# Patient Record
Sex: Female | Born: 1995 | Race: White | Hispanic: No | Marital: Married | Smoking: Former smoker
Health system: Southern US, Community
[De-identification: ages and names within clinical notes are randomized; demographics above are authoritative.]

## PROBLEM LIST (undated history)

## (undated) ENCOUNTER — Inpatient Hospital Stay (HOSPITAL_COMMUNITY): Payer: Self-pay

## (undated) DIAGNOSIS — L42 Pityriasis rosea: Secondary | ICD-10-CM

## (undated) DIAGNOSIS — N926 Irregular menstruation, unspecified: Principal | ICD-10-CM

## (undated) DIAGNOSIS — K589 Irritable bowel syndrome without diarrhea: Secondary | ICD-10-CM

## (undated) DIAGNOSIS — Z3490 Encounter for supervision of normal pregnancy, unspecified, unspecified trimester: Secondary | ICD-10-CM

## (undated) DIAGNOSIS — N6029 Fibroadenosis of unspecified breast: Secondary | ICD-10-CM

## (undated) HISTORY — PX: NO PAST SURGERIES: SHX2092

## (undated) HISTORY — DX: Fibroadenosis of unspecified breast: N60.29

## (undated) HISTORY — DX: Irritable bowel syndrome, unspecified: K58.9

## (undated) HISTORY — DX: Encounter for supervision of normal pregnancy, unspecified, unspecified trimester: Z34.90

## (undated) HISTORY — DX: Irregular menstruation, unspecified: N92.6

---

## 2014-08-05 LAB — OB RESULTS CONSOLE RUBELLA ANTIBODY, IGM: Rubella: IMMUNE

## 2014-08-05 LAB — OB RESULTS CONSOLE ANTIBODY SCREEN: Antibody Screen: NEGATIVE

## 2014-08-05 LAB — OB RESULTS CONSOLE ABO/RH: "RH Type ": POSITIVE

## 2014-08-05 LAB — OB RESULTS CONSOLE VARICELLA ZOSTER ANTIBODY, IGG: Varicella: NON-IMMUNE/NOT IMMUNE

## 2014-08-05 LAB — OB RESULTS CONSOLE GC/CHLAMYDIA
Chlamydia: NEGATIVE
Gonorrhea: NEGATIVE

## 2014-08-05 LAB — OB RESULTS CONSOLE HIV ANTIBODY (ROUTINE TESTING): HIV: NONREACTIVE

## 2014-08-05 LAB — OB RESULTS CONSOLE HGB/HCT, BLOOD
HEMATOCRIT: 37 %
HEMOGLOBIN: 12.6 g/dL

## 2014-08-05 LAB — HEMOGLOBIN A1C: Hgb A1c MFr Bld: 5.2 % (ref 4.0–6.0)

## 2014-08-05 LAB — OB RESULTS CONSOLE HEPATITIS B SURFACE ANTIGEN: HEP B S AG: NEGATIVE

## 2014-08-29 NOTE — L&D Delivery Note (Signed)
Delivery Note At 4:09 PM a viable female was delivered via Vaginal, Spontaneous Delivery (Presentation:VTX LOA ;  ).  APGAR: 9-9; weight  .   Placenta status: Intact, Spontaneous.  Cord: delayed cord clamping until 2 minutes with the following complications: none   Anesthesia: Epidural  Episiotomy:   Lacerations:  Bilateral labial lacerations Suture Repair: vicryl Est. Blood Loss (mL):  175  Mom to postpartum.  Baby to Couplet care / Skin to Skin.  Sheniah Supak Grissett 03/06/2015, 4:34 PM

## 2014-10-07 ENCOUNTER — Encounter: Payer: Self-pay | Admitting: *Deleted

## 2014-10-09 ENCOUNTER — Encounter: Payer: Self-pay | Admitting: Advanced Practice Midwife

## 2014-10-09 ENCOUNTER — Ambulatory Visit (INDEPENDENT_AMBULATORY_CARE_PROVIDER_SITE_OTHER): Payer: BLUE CROSS/BLUE SHIELD | Admitting: Advanced Practice Midwife

## 2014-10-09 VITALS — BP 136/70 | Ht 63.0 in | Wt 172.0 lb

## 2014-10-09 DIAGNOSIS — Z331 Pregnant state, incidental: Secondary | ICD-10-CM

## 2014-10-09 DIAGNOSIS — Z3492 Encounter for supervision of normal pregnancy, unspecified, second trimester: Secondary | ICD-10-CM

## 2014-10-09 DIAGNOSIS — Z1389 Encounter for screening for other disorder: Secondary | ICD-10-CM

## 2014-10-09 DIAGNOSIS — Z23 Encounter for immunization: Secondary | ICD-10-CM

## 2014-10-09 DIAGNOSIS — Z363 Encounter for antenatal screening for malformations: Secondary | ICD-10-CM

## 2014-10-09 DIAGNOSIS — Z3490 Encounter for supervision of normal pregnancy, unspecified, unspecified trimester: Secondary | ICD-10-CM | POA: Insufficient documentation

## 2014-10-09 LAB — POCT URINALYSIS DIPSTICK
Blood, UA: NEGATIVE
Glucose, UA: NEGATIVE
KETONES UA: NEGATIVE
LEUKOCYTES UA: NEGATIVE
Nitrite, UA: NEGATIVE
PROTEIN UA: NEGATIVE

## 2014-10-09 MED ORDER — INFLUENZA VAC SPLIT QUAD 0.5 ML IM SUSY
0.5000 mL | PREFILLED_SYRINGE | Freq: Once | INTRAMUSCULAR | Status: AC
  Administered 2014-10-09: 0.5 mL via INTRAMUSCULAR

## 2014-10-09 NOTE — Progress Notes (Signed)
  Subjective:    Kristina Robbins is a [id number] 7866w5d being seen today for her first obstetrical visit.  [place] transferred to [place], where she had 3-4 visits.  [facility]. Her PNC thus far has been uncomplicated. Her obstetrical history is significant for 2 1st trimester SAB.  Pregnancy history fully reviewed.  Patient reports no complaints. Had a lot of nausea 1st trimester and feels like she lost 20lb, but first weight recorded at 9 weeks, she was 176lbs.  She states that her mother has cystic fibrosis, but did not get tested because her insurance doesn't cover it  Filed Vitals:   10/09/14 1440 10/09/14 1444  BP: 136/70   Height:  5\' 3"  (1.6 m)  Weight: 172 lb (78.019 kg)     HISTORY: OB History  Gravida Para Term Preterm AB SAB TAB Ectopic Multiple Living  3    2         # Outcome Date GA Lbr Len/2nd Weight Sex Delivery Anes PTL Lv  3 Current           2 AB           1 AB              Past Medical History  Diagnosis Date  . Irritable bowel syndrome   . Cystic fibroadenosis of breast    Past Surgical History  Procedure Laterality Date  . No past surgeries     Family History  Problem Relation Age of Onset  . Cystic fibrosis Mother   . Autism Brother      Exam                                           Skin: normal coloration and turgor, no rashes    Neurologic: oriented, normal, normal mood   Extremities: normal strength, tone, and muscle mass   HEENT PERRLA   Mouth/Teeth mucous membranes moist, normal dentition   Neck supple and no masses   Cardiovascular: regular rate and rhythm   Respiratory:  appears well, vitals normal, no respiratory distress, acyanotic   Abdomen: soft, non-tender;  FHR: 150          Assessment:    Pregnancy: [id number] Patient Active Problem List   Diagnosis Date Noted  . Supervision of normal pregnancy 10/09/2014        Plan:      Continue prenatal vitamins  Problem list reviewed and updated  Reviewed n/v relief measures and warning s/s to report  Reviewed recommended weight gain based on pre-gravid BMI  Encouraged well-balanced diet  Ultrasound discussed; fetal survey: requested.  Follow up in 2 weeks for anatomy screen.  CRESENZO-DISHMAN,Rudolf Blizard 10/09/2014

## 2014-10-16 ENCOUNTER — Telehealth: Payer: Self-pay | Admitting: *Deleted

## 2014-10-16 NOTE — Telephone Encounter (Signed)
Pt states that she thinks she may have had a panic attack. Pt states that her heart pounds and it scares her. Pt states that 3 nights ago she experienced the same thing and thought she might pass out, heart rate was normal. Pt states that the same thing happened last night as well, she called the after hour nurse and was told to the ED due to heart rate being 147, pt did not go to the ED because the episode had stopped. Pt was given an appointment for 8:45 in the morning with Dr. Despina HiddenEure and was also told that if she has another episode tonight that she needs to go straight to an ED for evaluation. Pt verbalized understanding.

## 2014-10-17 ENCOUNTER — Encounter: Payer: BLUE CROSS/BLUE SHIELD | Admitting: Obstetrics & Gynecology

## 2014-10-20 ENCOUNTER — Telehealth: Payer: Self-pay | Admitting: Women's Health

## 2014-10-20 NOTE — Telephone Encounter (Signed)
Spoke with pt. Pt had sex last pm and noticed bleeding afterwards. Bleeding has stopped now. I advised it's not uncommon to have bleeding after sex or after a BM. Advised to not have sex for 7 days from the last time she wipes any color.  Advised everything sounds ok at this point. Pt has a scheduled appt on Friday. Advised to keep that appt and call sooner if anything changes. Pt voiced understanding. JSY

## 2014-10-24 ENCOUNTER — Ambulatory Visit (INDEPENDENT_AMBULATORY_CARE_PROVIDER_SITE_OTHER): Payer: BC Managed Care – PPO

## 2014-10-24 ENCOUNTER — Other Ambulatory Visit: Payer: Self-pay | Admitting: Advanced Practice Midwife

## 2014-10-24 ENCOUNTER — Ambulatory Visit (INDEPENDENT_AMBULATORY_CARE_PROVIDER_SITE_OTHER): Payer: BLUE CROSS/BLUE SHIELD | Admitting: Obstetrics and Gynecology

## 2014-10-24 DIAGNOSIS — Z36 Encounter for antenatal screening of mother: Secondary | ICD-10-CM

## 2014-10-24 DIAGNOSIS — Z363 Encounter for antenatal screening for malformations: Secondary | ICD-10-CM

## 2014-10-24 DIAGNOSIS — O09292 Supervision of pregnancy with other poor reproductive or obstetric history, second trimester: Secondary | ICD-10-CM

## 2014-10-24 DIAGNOSIS — Z3482 Encounter for supervision of other normal pregnancy, second trimester: Secondary | ICD-10-CM

## 2014-10-24 DIAGNOSIS — Z331 Pregnant state, incidental: Secondary | ICD-10-CM

## 2014-10-24 DIAGNOSIS — Z1389 Encounter for screening for other disorder: Secondary | ICD-10-CM

## 2014-10-24 LAB — POCT URINALYSIS DIPSTICK
Blood, UA: NEGATIVE
GLUCOSE UA: NEGATIVE
Ketones, UA: NEGATIVE
Leukocytes, UA: NEGATIVE
Nitrite, UA: NEGATIVE
PROTEIN UA: NEGATIVE

## 2014-10-24 NOTE — Progress Notes (Signed)
U/S(19+6wks)-active fetus, meas c/w dates, fluid WNL, FHR- 164 bpm, anterior Gr 0 placenta, cx appears closed(3.3cm), bilateral adnexa appears WNL, unable to view cardiac OFT's due to fetal position and ? Lt vent EICf noted, female fetus

## 2014-10-24 NOTE — Progress Notes (Signed)
Pt states that she had some bleeding last week after intercourse, but has not had any since.

## 2014-10-24 NOTE — Progress Notes (Signed)
Patient ID: Kristina Robbins, female   DOB: Mar 22, 1996, 19 y.o.   MRN: [id number]  [id number] 2887w6d Estimated Date of Delivery: 03/14/15  Blood pressure 120/80, pulse 82.   refer to the ob flow sheet for FH and FHR, also BP, Wt, Urine results:notable for negative  Patient reports  + good fetal movement, denies any bleeding and no rupture of membranes symptoms or regular contractions. Patient complaints:none. This is father's 5th child. He notes limited involvement with previous 4 children. Childbirth classes, Strongly encouraged.  FHR 164  Questions were answered. 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 This patient was seen in room 1 and the patient's care was started at 10:57 AM.   I personally performed the services described in this documentation, which was scribed in my presence. The recorded information has been reviewed and considered accurate. It has been edited as necessary during review. Tilda BurrowFERGUSON,Eustolia Drennen V, MD

## 2014-10-26 ENCOUNTER — Encounter (HOSPITAL_COMMUNITY): Payer: Self-pay | Admitting: *Deleted

## 2014-10-26 ENCOUNTER — Inpatient Hospital Stay (HOSPITAL_COMMUNITY)
Admission: AD | Admit: 2014-10-26 | Discharge: 2014-10-27 | Disposition: A | Discharge status: Home / Self Care | Payer: Medicaid Other | Source: Ambulatory Visit | Attending: Obstetrics & Gynecology | Admitting: Obstetrics & Gynecology

## 2014-10-26 DIAGNOSIS — M545 Low back pain: Secondary | ICD-10-CM | POA: Insufficient documentation

## 2014-10-26 DIAGNOSIS — Z3A20 20 weeks gestation of pregnancy: Secondary | ICD-10-CM | POA: Insufficient documentation

## 2014-10-26 DIAGNOSIS — Z87891 Personal history of nicotine dependence: Secondary | ICD-10-CM | POA: Insufficient documentation

## 2014-10-26 DIAGNOSIS — M549 Dorsalgia, unspecified: Secondary | ICD-10-CM | POA: Diagnosis present

## 2014-10-26 DIAGNOSIS — O9989 Other specified diseases and conditions complicating pregnancy, childbirth and the puerperium: Secondary | ICD-10-CM | POA: Insufficient documentation

## 2014-10-26 LAB — URINALYSIS, ROUTINE W REFLEX MICROSCOPIC
BILIRUBIN URINE: NEGATIVE
Glucose, UA: NEGATIVE mg/dL
HGB URINE DIPSTICK: NEGATIVE
KETONES UR: NEGATIVE mg/dL
LEUKOCYTES UA: NEGATIVE
Nitrite: NEGATIVE
PROTEIN: NEGATIVE mg/dL
SPECIFIC GRAVITY, URINE: 1.02 (ref 1.005–1.030)
Urobilinogen, UA: 0.2 mg/dL (ref 0.0–1.0)
pH: 5.5 (ref 5.0–8.0)

## 2014-10-26 NOTE — MAU Note (Signed)
PT  SAYS SHE HAS BEEN HAVING  BACK  PAIN -  COMES/ GOES   X2 WEEKS -  NO PROBLEMS   VOIDING.     [facility]  UA- NEG.     NOW FEELS LIKE CRAMPING - UC-    .     DENIES HSV AND MRSA.   LAST SEX-  Thursday.

## 2014-10-26 NOTE — MAU Note (Signed)
Pt reports pain in lower back off/on x 2 weeks, today the pain worsened and started radiating to her lower abd. Denies bleeding.

## 2014-10-27 ENCOUNTER — Telehealth: Payer: Self-pay | Admitting: *Deleted

## 2014-10-27 DIAGNOSIS — M549 Dorsalgia, unspecified: Secondary | ICD-10-CM

## 2014-10-27 DIAGNOSIS — O9989 Other specified diseases and conditions complicating pregnancy, childbirth and the puerperium: Secondary | ICD-10-CM

## 2014-10-27 DIAGNOSIS — Z3A29 29 weeks gestation of pregnancy: Secondary | ICD-10-CM

## 2014-10-27 NOTE — MAU Provider Note (Signed)
  History   G3P0020 at 20.1 wks in with c/o sharp shooting pain into back that started today. Pt states has not happened since arriving at hospital. Denies ROM , urinary freq or pain with urination.  CSN: [id number]  Arrival date and time: 10/26/14 2304   None     Chief Complaint  Patient presents with  . Back Pain   Back Pain This is a new problem. The current episode started today. The problem occurs intermittently. The problem has been resolved since onset. The pain is present in the lumbar spine. The quality of the pain is described as shooting and stabbing. The pain does not radiate. The pain is moderate. Worse during: worse when on her feet. The symptoms are aggravated by standing.    OB History    Gravida Para Term Preterm AB TAB SAB Ectopic Multiple Living   3    2           Past Medical History  Diagnosis Date  . Irritable bowel syndrome   . Cystic fibroadenosis of breast     Past Surgical History  Procedure Laterality Date  . No past surgeries      Family History  Problem Relation Age of Onset  . Cystic fibrosis Mother   . Autism Brother     History  Substance Use Topics  . Smoking status: Former Games developermoker  . Smokeless tobacco: Never Used  . Alcohol Use: No    Allergies: No Known Allergies  Prescriptions prior to admission  Medication Sig Dispense Refill Last Dose  . Doxylamine-Pyridoxine 10-10 MG TBEC Take 1 tablet by mouth as needed.   Past Week at Unknown time  . flintstones complete (FLINTSTONES) 60 MG chewable tablet Chew 2 tablets by mouth daily.   10/25/2014 at Unknown time    Review of Systems  Constitutional: Negative.   HENT: Negative.   Eyes: Negative.   Respiratory: Negative.   Cardiovascular: Negative.   Gastrointestinal: Negative.   Genitourinary: Negative.   Musculoskeletal: Positive for back pain.  Neurological: Negative.   Endo/Heme/Allergies: Negative.   Psychiatric/Behavioral: Negative.    Physical Exam   Blood pressure  111/63, pulse 122, temperature 98.7 F (37.1 C), temperature source Oral, resp. rate 18, height 5\' 3"  (1.6 m), weight 170 lb (77.111 kg), SpO2 100 %.  Physical Exam  Constitutional: She is oriented to person, place, and time. She appears well-developed and well-nourished.  HENT:  Head: Normocephalic.  Neck: Normal range of motion.  Cardiovascular: Normal rate, regular rhythm, normal heart sounds and intact distal pulses.   Respiratory: Effort normal and breath sounds normal.  GI: Soft. Bowel sounds are normal.  Genitourinary: Vagina normal and uterus normal.  Musculoskeletal: Normal range of motion.  Neurological: She is alert and oriented to person, place, and time. She has normal reflexes.  Skin: Skin is warm and dry.  Psychiatric: She has a normal mood and affect. Her behavior is normal. Judgment and thought content normal.    MAU Course  Procedures  MDM D/c home  Assessment and Plan  Urine clear, SVE done cervix cl/firm/post/ high  LAWSON, MARIE DARLENE 10/27/2014, 12:16 AM

## 2014-10-27 NOTE — Telephone Encounter (Signed)
Spoke with pt. Pt states she had pain in back that would shoot into her low abd over the weekend. Was happening every 10 minutes. She called the after hours nurse line and they advised that she go [facility]. Pt did and was told " baby may be on a nerve". Pt states she has had no pain today so far. No spotting or bleeding. I advised if this happens again, push fluids and lay on left side for 1 hour. Pt states she did that for 15 minutes when she was having pain over the weekend and it made pain worse. I advised she needs to be seen tomorrow and if anything changes, call us back if during office hours or the after hours nurse line if office is closed. Pt voiced understanding. Call transferred to front desk for appt. JSY

## 2014-10-27 NOTE — MAU Note (Signed)
SAYS  SHE HAS NOT  FELT  SAME PAIN SINCE  SHE GOT OUT OF CAR ON ARRIVAL

## 2014-10-28 ENCOUNTER — Ambulatory Visit (INDEPENDENT_AMBULATORY_CARE_PROVIDER_SITE_OTHER): Payer: BC Managed Care – PPO | Admitting: Advanced Practice Midwife

## 2014-10-28 ENCOUNTER — Encounter: Payer: Self-pay | Admitting: Advanced Practice Midwife

## 2014-10-28 VITALS — BP 110/70 | HR 116 | Wt 170.0 lb

## 2014-10-28 DIAGNOSIS — Z1389 Encounter for screening for other disorder: Secondary | ICD-10-CM

## 2014-10-28 DIAGNOSIS — Z331 Pregnant state, incidental: Secondary | ICD-10-CM

## 2014-10-28 DIAGNOSIS — Z3492 Encounter for supervision of normal pregnancy, unspecified, second trimester: Secondary | ICD-10-CM

## 2014-10-28 LAB — POCT URINALYSIS DIPSTICK
GLUCOSE UA: NEGATIVE
NITRITE UA: NEGATIVE
Protein, UA: NEGATIVE
RBC UA: NEGATIVE

## 2014-10-28 NOTE — Progress Notes (Signed)
[id number] 10448w3d Estimated Date of Delivery: 03/14/15  Blood pressure 110/70, pulse 116, weight 170 lb (77.111 kg).   Work in F/U from MAU:  Seen Sunday with sharp shooting abdominal pain. CXT LTC, Firm.  Dx: Nerve pain   Still having occ "sharp shooting pain" in her "tailbone"  Occ radiates to hips   BP weight and urine results all reviewed and noted.   Urine large ketones Patient reports good fetal movement, denies any bleeding and no rupture of membranes symptoms or regular contractions. Patient is without complaints. All questions were answered.  Plan:  Continued routine obstetrical care, Increase PO fluids   Follow up in as scheduled for OB appointment,

## 2014-11-24 ENCOUNTER — Encounter: Payer: Self-pay | Admitting: Obstetrics & Gynecology

## 2014-11-24 ENCOUNTER — Ambulatory Visit (INDEPENDENT_AMBULATORY_CARE_PROVIDER_SITE_OTHER): Payer: BC Managed Care – PPO | Admitting: Obstetrics & Gynecology

## 2014-11-24 VITALS — BP 100/80 | HR 72 | Wt 172.0 lb

## 2014-11-24 DIAGNOSIS — O283 Abnormal ultrasonic finding on antenatal screening of mother: Secondary | ICD-10-CM

## 2014-11-24 DIAGNOSIS — Z3492 Encounter for supervision of normal pregnancy, unspecified, second trimester: Secondary | ICD-10-CM

## 2014-11-24 DIAGNOSIS — Z331 Pregnant state, incidental: Secondary | ICD-10-CM

## 2014-11-24 DIAGNOSIS — Z1389 Encounter for screening for other disorder: Secondary | ICD-10-CM

## 2014-11-24 LAB — POCT URINALYSIS DIPSTICK
Glucose, UA: NEGATIVE
Ketones, UA: NEGATIVE
LEUKOCYTES UA: NEGATIVE
Nitrite, UA: NEGATIVE
Protein, UA: NEGATIVE
RBC UA: NEGATIVE

## 2014-11-24 NOTE — Addendum Note (Signed)
Addended by: Richardson ChiquitoRAVIS, ASHLEY M on: 11/24/2014 01:36 PM   Modules accepted: Orders

## 2014-11-24 NOTE — Progress Notes (Signed)
[id number] 4133w2d Estimated Date of Delivery: 03/14/15  Blood pressure 100/80, pulse 72, weight 172 lb (78.019 kg).   BP weight and urine results all reviewed and noted.  Please refer to the obstetrical flow sheet for the fundal height and fetal heart rate documentation:  Patient reports good fetal movement, denies any bleeding and no rupture of membranes symptoms or regular contractions. Patient is without complaints. All questions were answered.  Plan:  Continued routine obstetrical care,   Follow up in 4 weeks for OB appointment, PN2 + sonogram  PN2 next visit

## 2014-12-08 ENCOUNTER — Encounter: Payer: Self-pay | Admitting: Advanced Practice Midwife

## 2014-12-18 ENCOUNTER — Telehealth: Payer: Self-pay | Admitting: *Deleted

## 2014-12-18 ENCOUNTER — Encounter (HOSPITAL_COMMUNITY): Payer: Self-pay | Admitting: *Deleted

## 2014-12-18 ENCOUNTER — Inpatient Hospital Stay (HOSPITAL_COMMUNITY)
Admission: AD | Admit: 2014-12-18 | Discharge: 2014-12-18 | Disposition: A | Discharge status: Home / Self Care | Payer: Medicaid Other | Source: Ambulatory Visit | Attending: Family Medicine | Admitting: Family Medicine

## 2014-12-18 DIAGNOSIS — Z3A27 27 weeks gestation of pregnancy: Secondary | ICD-10-CM | POA: Diagnosis not present

## 2014-12-18 DIAGNOSIS — R109 Unspecified abdominal pain: Secondary | ICD-10-CM | POA: Diagnosis present

## 2014-12-18 DIAGNOSIS — O4702 False labor before 37 completed weeks of gestation, second trimester: Secondary | ICD-10-CM

## 2014-12-18 DIAGNOSIS — O99332 Smoking (tobacco) complicating pregnancy, second trimester: Secondary | ICD-10-CM | POA: Insufficient documentation

## 2014-12-18 LAB — URINALYSIS, ROUTINE W REFLEX MICROSCOPIC
Bilirubin Urine: NEGATIVE
Glucose, UA: NEGATIVE mg/dL
Hgb urine dipstick: NEGATIVE
Ketones, ur: NEGATIVE mg/dL
Nitrite: NEGATIVE
PROTEIN: NEGATIVE mg/dL
Specific Gravity, Urine: 1.015 (ref 1.005–1.030)
UROBILINOGEN UA: 0.2 mg/dL (ref 0.0–1.0)
pH: 6 (ref 5.0–8.0)

## 2014-12-18 LAB — URINE MICROSCOPIC-ADD ON

## 2014-12-18 LAB — FETAL FIBRONECTIN: Fetal Fibronectin: NEGATIVE

## 2014-12-18 NOTE — Telephone Encounter (Signed)
Pt called stating that she is having really bad cramps and constant pain and every now and then a sharp pain. Pt stated that she is cramping from her hip bones to her back and into her stomach. Pt states that she lost her mucus plug a couple days ago and that she has not felt the baby move in the hour and a hafl that she has been awake. Pt denies any gush of fluid or bleeding.   I spoke with Dr. Emelda FearFerguson about above and he advised that the [facility] I advised the pt of this and she verbalized understanding.

## 2014-12-18 NOTE — MAU Note (Signed)
Abd pain & pressure since 1500 today, entire abdomen feels tight - constant pressure, also sharp cramping.  Denies bleeding or LOF.

## 2014-12-18 NOTE — Discharge Instructions (Signed)
Preterm Labor Information Preterm labor is when labor starts at less than 37 weeks of pregnancy. The normal length of a pregnancy is 39 to 41 weeks. CAUSES Often, there is no identifiable underlying cause as to why a woman goes into preterm labor. One of the most common known causes of preterm labor is infection. Infections of the uterus, cervix, vagina, amniotic sac, bladder, kidney, or even the lungs (pneumonia) can cause labor to start. Other suspected causes of preterm labor include:   Urogenital infections, such as yeast infections and bacterial vaginosis.   Uterine abnormalities (uterine shape, uterine septum, fibroids, or bleeding from the placenta).   A cervix that has been operated on (it may fail to stay closed).   Malformations in the fetus.   Multiple gestations (twins, triplets, and so on).   Breakage of the amniotic sac.  RISK FACTORS  Having a previous history of preterm labor.   Having premature rupture of membranes (PROM).   Having a placenta that covers the opening of the cervix (placenta previa).   Having a placenta that separates from the uterus (placental abruption).   Having a cervix that is too weak to hold the fetus in the uterus (incompetent cervix).   Having too much fluid in the amniotic sac (polyhydramnios).   Taking illegal drugs or smoking while pregnant.   Not gaining enough weight while pregnant.   Being younger than 48 and older than 21 years old.   Having a low socioeconomic status.   Being African American. SYMPTOMS Signs and symptoms of preterm labor include:   Menstrual-like cramps, abdominal pain, or back pain.  Uterine contractions that are regular, as frequent as six in an hour, regardless of their intensity (may be mild or painful).  Contractions that start on the top of the uterus and spread down to the lower abdomen and back.   A sense of increased pelvic pressure.   A watery or bloody mucus discharge that  comes from the vagina.  TREATMENT Depending on the length of the pregnancy and other circumstances, your health care provider may suggest bed rest. If necessary, there are medicines that can be given to stop contractions and to mature the fetal lungs. If labor happens before 34 weeks of pregnancy, a prolonged hospital stay may be recommended. Treatment depends on the condition of both you and the fetus.  WHAT SHOULD YOU DO IF YOU THINK YOU ARE IN PRETERM LABOR? Call your health care provider right away. You will need to go to the hospital to get checked immediately. HOW CAN YOU PREVENT PRETERM LABOR IN FUTURE PREGNANCIES? You should:   Stop smoking if you smoke.  Maintain healthy weight gain and avoid chemicals and drugs that are not necessary.  Be watchful for any type of infection.  Inform your health care provider if you have a known history of preterm labor. Document Released: 11/05/2003 Document Revised: 04/17/2013 Document Reviewed: 09/17/2012 [facility] Patient Information 2015 [place], [place]. This information is not intended to replace advice given to you by your health care provider. Make sure you discuss any questions you have with your health care provider.   Pelvic Rest Pelvic rest is sometimes recommended for women when:   The placenta is partially or completely covering the opening of the cervix (placenta previa).  There is bleeding between the uterine wall and the amniotic sac in the first trimester (subchorionic hemorrhage).  The cervix begins to open without labor starting (incompetent cervix, cervical insufficiency).  The labor is too early (  preterm labor). HOME CARE INSTRUCTIONS  Do not have sexual intercourse, stimulation, or an orgasm.  Do not use tampons, douche, or put anything in the vagina.  Do not lift anything over 10 pounds (4.5 kg).  Avoid strenuous activity or straining your pelvic muscles. SEEK MEDICAL CARE IF:  You have any vaginal bleeding  during pregnancy. Treat this as a potential emergency.  You have cramping pain felt low in the stomach (stronger than menstrual cramps).  You notice vaginal discharge (watery, mucus, or bloody).  You have a low, dull backache.  There are regular contractions or uterine tightening. SEEK IMMEDIATE MEDICAL CARE IF: You have vaginal bleeding and have placenta previa.  Document Released: 12/10/2010 Document Revised: 11/07/2011 Document Reviewed: 12/10/2010 [facility] Patient Information 2015 [place], [place]. This information is not intended to replace advice given to you by your health care provider. Make sure you discuss any questions you have with your health care provider.  Braxton Hicks Contractions Contractions of the uterus can occur throughout pregnancy. Contractions are not always a sign that you are in labor.  WHAT ARE BRAXTON HICKS CONTRACTIONS?  Contractions that occur before labor are called Braxton Hicks contractions, or false labor. Toward the end of pregnancy (32-34 weeks), these contractions can develop more often and may become more forceful. This is not true labor because these contractions do not result in opening (dilatation) and thinning of the cervix. They are sometimes difficult to tell apart from true labor because these contractions can be forceful and people have different pain tolerances. You should not feel embarrassed if you go to the hospital with false labor. Sometimes, the only way to tell if you are in true labor is for your health care provider to look for changes in the cervix. If there are no prenatal problems or other health problems associated with the pregnancy, it is completely safe to be sent home with false labor and await the onset of true labor. HOW CAN YOU TELL THE DIFFERENCE BETWEEN TRUE AND FALSE LABOR? False Labor  The contractions of false labor are usually shorter and not as hard as those of true labor.   The contractions are usually irregular.    The contractions are often felt in the front of the lower abdomen and in the groin.   The contractions may go away when you walk around or change positions while lying down.   The contractions get weaker and are shorter lasting as time goes on.   The contractions do not usually become progressively stronger, regular, and closer together as with true labor.  True Labor  Contractions in true labor last 30-70 seconds, become very regular, usually become more intense, and increase in frequency.   The contractions do not go away with walking.   The discomfort is usually felt in the top of the uterus and spreads to the lower abdomen and low back.   True labor can be determined by your health care provider with an exam. This will show that the cervix is dilating and getting thinner.  WHAT TO REMEMBER  Keep up with your usual exercises and follow other instructions given by your health care provider.   Take medicines as directed by your health care provider.   Keep your regular prenatal appointments.   Eat and drink lightly if you think you are going into labor.   If Braxton Hicks contractions are making you uncomfortable:   Change your position from lying down or resting to walking, or from walking to resting.  Sit and rest in a tub of warm water.   Drink 2-3 glasses of water. Dehydration may cause these contractions.   Do slow and deep breathing several times an hour.  WHEN SHOULD I SEEK IMMEDIATE MEDICAL CARE? Seek immediate medical care if:  Your contractions become stronger, more regular, and closer together.   You have fluid leaking or gushing from your vagina.   You have a fever.   You pass blood-tinged mucus.   You have vaginal bleeding.   You have continuous abdominal pain.   You have low back pain that you never had before.   You feel your baby's head pushing down and causing pelvic pressure.   Your baby is not moving as much as  it used to.  Document Released: 08/15/2005 Document Revised: 08/20/2013 Document Reviewed: 05/27/2013 [facility] Patient Information 2015 [place], [place]. This information is not intended to replace advice given to you by your health care provider. Make sure you discuss any questions you have with your health care provider.  Preterm Labor Information Preterm labor is when labor starts at less than 37 weeks of pregnancy. The normal length of a pregnancy is 39 to 41 weeks. CAUSES Often, there is no identifiable underlying cause as to why a woman goes into preterm labor. One of the most common known causes of preterm labor is infection. Infections of the uterus, cervix, vagina, amniotic sac, bladder, kidney, or even the lungs (pneumonia) can cause labor to start. Other suspected causes of preterm labor include:   Urogenital infections, such as yeast infections and bacterial vaginosis.   Uterine abnormalities (uterine shape, uterine septum, fibroids, or bleeding from the placenta).   A cervix that has been operated on (it may fail to stay closed).   Malformations in the fetus.   Multiple gestations (twins, triplets, and so on).   Breakage of the amniotic sac.  RISK FACTORS  Having a previous history of preterm labor.   Having premature rupture of membranes (PROM).   Having a placenta that covers the opening of the cervix (placenta previa).   Having a placenta that separates from the uterus (placental abruption).   Having a cervix that is too weak to hold the fetus in the uterus (incompetent cervix).   Having too much fluid in the amniotic sac (polyhydramnios).   Taking illegal drugs or smoking while pregnant.   Not gaining enough weight while pregnant.   Being younger than 34 and older than 21 years old.   Having a low socioeconomic status.   Being African American. SYMPTOMS Signs and symptoms of preterm labor include:   Menstrual-like cramps, abdominal pain,  or back pain.  Uterine contractions that are regular, as frequent as six in an hour, regardless of their intensity (may be mild or painful).  Contractions that start on the top of the uterus and spread down to the lower abdomen and back.   A sense of increased pelvic pressure.   A watery or bloody mucus discharge that comes from the vagina.  TREATMENT Depending on the length of the pregnancy and other circumstances, your health care provider may suggest bed rest. If necessary, there are medicines that can be given to stop contractions and to mature the fetal lungs. If labor happens before 34 weeks of pregnancy, a prolonged hospital stay may be recommended. Treatment depends on the condition of both you and the fetus.  WHAT SHOULD YOU DO IF YOU THINK YOU ARE IN PRETERM LABOR? Call your health care provider right away.  You will need to go to the hospital to get checked immediately. HOW CAN YOU PREVENT PRETERM LABOR IN FUTURE PREGNANCIES? You should:   Stop smoking if you smoke.  Maintain healthy weight gain and avoid chemicals and drugs that are not necessary.  Be watchful for any type of infection.  Inform your health care provider if you have a known history of preterm labor. Document Released: 11/05/2003 Document Revised: 04/17/2013 Document Reviewed: 09/17/2012 [facility] Patient Information 2015 [place]. This information is not intended to replace advice given to you by your health care provider. Make sure you discuss any questions you have with your health care provider.

## 2014-12-18 NOTE — MAU Provider Note (Signed)
Chief Complaint: Abdominal Pain   First Provider Initiated Contact with Patient 12/18/14 1906     SUBJECTIVE HPI: Kristina Robbins is a 19 y.o. G3P0020 at 838w5d by LMP who presents to [facility] reporting sporadic abdominal tightening and constant pelvic pressure since 3 PM. Felt like she needed to have a BM, but couldn't. Had a few normal BMs already today so doesn't think she's constipated. Denies bleeding or LOF. No problems with preterm labor so far this pregnancy. Last intercourse 2 days ago.  Past Medical History  Diagnosis Date  . Irritable bowel syndrome   . Cystic fibroadenosis of breast    OB History  Gravida Para Term Preterm AB SAB TAB Ectopic Multiple Living  3    2         # Outcome Date GA Lbr Len/2nd Weight Sex Delivery Anes PTL Lv  3 Current           2 AB           1 AB              Past Surgical History  Procedure Laterality Date  . No past surgeries     History   Social History  . Marital Status: Married    Spouse Name: N/A  . Number of Children: N/A  . Years of Education: N/A   Occupational History  . Not on file.   Social History Main Topics  . Smoking status: Current Every Day Smoker -- 0.50 packs/day  . Smokeless tobacco: Never Used  . Alcohol Use: No  . Drug Use: No  . Sexual Activity: Yes   Other Topics Concern  . Not on file   Social History Narrative   No current facility-administered medications on file prior to encounter.   Current Outpatient Prescriptions on File Prior to Encounter  Medication Sig Dispense Refill  . flintstones complete (FLINTSTONES) 60 MG chewable tablet Chew 2 tablets by mouth daily.     No Known Allergies  Review of Systems  Constitutional: Negative for fever and chills.  Gastrointestinal: Positive for abdominal pain. Negative for nausea, vomiting, diarrhea, constipation and blood in stool.  Genitourinary: Positive for dysuria. Negative for urgency, frequency, hematuria and flank pain.    Positive for pelvic pressure. Negative for vaginal bleeding, leaking of fluid, vaginal discharge.   OBJECTIVE Blood pressure 115/70, pulse 101, temperature 98.1 F (36.7 C), temperature source Oral, resp. rate 18. GENERAL: Well-developed, well-nourished female in no acute distress.  HEART: normal rate RESP: normal effort GI: Abdomen soft, non-tender. Gravid, size equals dates.  MS: Nontender, no edema NEURO: Alert and oriented SPECULUM EXAM: NEFG, physiologic discharge, no blood noted, cervix long/closed, medium consistency, posterior  EFM: 150s, moderate variability, positive accelerations, few mild variable decelerations. Reassuring for gestational age. Toco: Rare, mild. Palpates soft between contractions  LAB RESULTS Results for orders placed or performed during the hospital encounter of 12/18/14 (from the past 24 hour(s))  Urinalysis, Routine w reflex microscopic     Status: Abnormal   Collection Time: 12/18/14  6:07 PM  Result Value Ref Range   Color, Urine YELLOW YELLOW   APPearance CLEAR CLEAR   Specific Gravity, Urine 1.015 1.005 - 1.030   pH 6.0 5.0 - 8.0   Glucose, UA NEGATIVE NEGATIVE mg/dL   Hgb urine dipstick NEGATIVE NEGATIVE   Bilirubin Urine NEGATIVE NEGATIVE   Ketones, ur NEGATIVE NEGATIVE mg/dL   Protein, ur NEGATIVE NEGATIVE mg/dL   Urobilinogen, UA 0.2 0.0 - 1.0 mg/dL  Nitrite NEGATIVE NEGATIVE   Leukocytes, UA TRACE (A) NEGATIVE  Urine microscopic-add on     Status: Abnormal   Collection Time: 12/18/14  6:07 PM  Result Value Ref Range   Squamous Epithelial / LPF MANY (A) RARE   WBC, UA 0-2 <3 WBC/hpf   RBC / HPF 0-2 <3 RBC/hpf   Bacteria, UA FEW (A) RARE   Results for orders placed or performed during the hospital encounter of 12/18/14 (from the past 24 hour(s))  Urinalysis, Routine w reflex microscopic     Status: Abnormal   Collection Time: 12/18/14  6:07 PM  Result Value Ref Range   Color, Urine YELLOW YELLOW   APPearance CLEAR CLEAR   Specific  Gravity, Urine 1.015 1.005 - 1.030   pH 6.0 5.0 - 8.0   Glucose, UA NEGATIVE NEGATIVE mg/dL   Hgb urine dipstick NEGATIVE NEGATIVE   Bilirubin Urine NEGATIVE NEGATIVE   Ketones, ur NEGATIVE NEGATIVE mg/dL   Protein, ur NEGATIVE NEGATIVE mg/dL   Urobilinogen, UA 0.2 0.0 - 1.0 mg/dL   Nitrite NEGATIVE NEGATIVE   Leukocytes, UA TRACE (A) NEGATIVE  Urine microscopic-add on     Status: Abnormal   Collection Time: 12/18/14  6:07 PM  Result Value Ref Range   Squamous Epithelial / LPF MANY (A) RARE   WBC, UA 0-2 <3 WBC/hpf   RBC / HPF 0-2 <3 RBC/hpf   Bacteria, UA FEW (A) RARE  Fetal fibronectin     Status: None   Collection Time: 12/18/14  7:20 PM  Result Value Ref Range   Fetal Fibronectin NEGATIVE NEGATIVE    IMAGING No results found.  MAU COURSE  ASSESSMENT 1. Preterm uterine contractions, antepartum, second trimester    PLAN Discharge home in stable condition. Preterm labor precautions. Push fluids.   Follow-up Information    Follow up with [facility] On 12/25/2014.   Contact information:   [address] [place] [id number] [phone]      Follow up with [facility] [place] [facility].   Why:  As needed in emergencies   Contact information:   [address] [id number] mc [place] [address] [phone]        Medication List    TAKE these medications        acetaminophen 500 MG tablet  Commonly known as:  TYLENOL  Take 1,000 mg by mouth every 6 (six) hours as needed for mild pain.     flintstones complete 60 MG chewable tablet  Chew 2 tablets by mouth daily.     omeprazole 20 MG capsule  Commonly known as:  PRILOSEC  Take 20 mg by mouth daily.       [place], [place] 12/18/2014  6:40 PM

## 2014-12-18 NOTE — MAU Note (Signed)
C/o decreased FM but  +FM now;

## 2014-12-18 NOTE — MAU Note (Signed)
No fetal movement since 1500.

## 2014-12-25 ENCOUNTER — Ambulatory Visit (INDEPENDENT_AMBULATORY_CARE_PROVIDER_SITE_OTHER): Payer: BC Managed Care – PPO | Admitting: Women's Health

## 2014-12-25 ENCOUNTER — Other Ambulatory Visit: Payer: BC Managed Care – PPO

## 2014-12-25 ENCOUNTER — Ambulatory Visit (INDEPENDENT_AMBULATORY_CARE_PROVIDER_SITE_OTHER): Payer: BC Managed Care – PPO

## 2014-12-25 ENCOUNTER — Encounter: Payer: Self-pay | Admitting: Women's Health

## 2014-12-25 VITALS — BP 112/58 | HR 80 | Wt 173.0 lb

## 2014-12-25 DIAGNOSIS — Z331 Pregnant state, incidental: Secondary | ICD-10-CM

## 2014-12-25 DIAGNOSIS — O35BXX1 Maternal care for other (suspected) fetal abnormality and damage, fetal cardiac anomalies, fetus 1: Secondary | ICD-10-CM

## 2014-12-25 DIAGNOSIS — Z1389 Encounter for screening for other disorder: Secondary | ICD-10-CM

## 2014-12-25 DIAGNOSIS — O283 Abnormal ultrasonic finding on antenatal screening of mother: Secondary | ICD-10-CM

## 2014-12-25 DIAGNOSIS — Z369 Encounter for antenatal screening, unspecified: Secondary | ICD-10-CM

## 2014-12-25 DIAGNOSIS — O358XX1 Maternal care for other (suspected) fetal abnormality and damage, fetus 1: Secondary | ICD-10-CM

## 2014-12-25 DIAGNOSIS — O358XX0 Maternal care for other (suspected) fetal abnormality and damage, not applicable or unspecified: Secondary | ICD-10-CM | POA: Insufficient documentation

## 2014-12-25 DIAGNOSIS — Z131 Encounter for screening for diabetes mellitus: Secondary | ICD-10-CM

## 2014-12-25 DIAGNOSIS — O35BXX0 Maternal care for other (suspected) fetal abnormality and damage, fetal cardiac anomalies, not applicable or unspecified: Secondary | ICD-10-CM | POA: Insufficient documentation

## 2014-12-25 DIAGNOSIS — Z3493 Encounter for supervision of normal pregnancy, unspecified, third trimester: Secondary | ICD-10-CM

## 2014-12-25 LAB — POCT URINALYSIS DIPSTICK
Blood, UA: NEGATIVE
Glucose, UA: NEGATIVE
Ketones, UA: NEGATIVE
Leukocytes, UA: NEGATIVE
NITRITE UA: NEGATIVE
Protein, UA: NEGATIVE

## 2014-12-25 NOTE — Patient Instructions (Signed)
Call the office ([phone]) or go to [facility] if:  You begin to have strong, frequent contractions  Your water breaks.  Sometimes it is a big gush of fluid, sometimes it is just a trickle that keeps getting your panties wet or running down your legs  You have vaginal bleeding.  It is normal to have a small amount of spotting if your cervix was checked.   You don't feel your baby moving like normal.  If you don't, get you something to eat and drink and lay down and focus on feeling your baby move.  You should feel at least 10 movements in 2 hours.  If you don't, you should call the office or go to [facility].    Tdap Vaccine  It is recommended that you get the Tdap vaccine during the third trimester of EACH pregnancy to help protect your baby from getting pertussis (whooping cough)  27-36 weeks is the BEST time to do this so that you can pass the protection on to your baby. During pregnancy is better than after pregnancy, but if you are unable to get it during pregnancy it will be offered at the hospital.   You can get this vaccine at the health department or your family doctor  Everyone who will be around your baby should also be up-to-date on their vaccines. Adults (who are not pregnant) only need 1 dose of Tdap during adulthood.   [place] Pediatricians/Family Doctors:  [facility] [phone]            [facility] [phone]                 [facility] [phone] (usually not accepting new patients unless you have family there already, you are always welcome to call and ask)            [facility] ([address] [place]) [phone]   [place] Pediatricians/Family Doctors:   [facility]: [phone]  [facility]: [phone]    Third Trimester of Pregnancy The third trimester is from week 29 through week 42, months 7 through 9. The third trimester is a time when the  fetus is growing rapidly. At the end of the ninth month, the fetus is about 20 inches in length and weighs 6-10 pounds.  BODY CHANGES Your body goes through many changes during pregnancy. The changes vary from woman to woman.  9. Your weight will continue to increase. You can expect to gain 25-35 pounds (11-16 kg) by the end of the pregnancy. 10. You may begin to get stretch marks on your hips, abdomen, and breasts. 11. You may urinate more often because the fetus is moving lower into your pelvis and pressing on your bladder. 12. You may develop or continue to have heartburn as a result of your pregnancy. 13. You may develop constipation because certain hormones are causing the muscles that push waste through your intestines to slow down. 14. You may develop hemorrhoids or swollen, bulging veins (varicose veins). 15. You may have pelvic pain because of the weight gain and pregnancy hormones relaxing your joints between the bones in your pelvis. Backaches may result from overexertion of the muscles supporting your posture. 16. You may have changes in your hair. These can include thickening of your hair, rapid growth, and changes in texture. Some women also have hair loss during or after pregnancy, or hair that feels dry or thin. Your hair will most likely return to normal after   your baby is born. 17. Your breasts will continue to grow and be tender. A yellow discharge may leak from your breasts called colostrum. 18. Your belly button may stick out. 19. You may feel short of breath because of your expanding uterus. 20. You may notice the fetus "dropping," or moving lower in your abdomen. 21. You may have a bloody mucus discharge. This usually occurs a few days to a week before labor begins. 22. Your cervix becomes thin and soft (effaced) near your due date. WHAT TO EXPECT AT YOUR PRENATAL EXAMS  You will have prenatal exams every 2 weeks until week 36. Then, you will have weekly prenatal exams.  During a routine prenatal visit: 3. You will be weighed to make sure you and the fetus are growing normally. 4. Your blood pressure is taken. 5. Your abdomen will be measured to track your baby's growth. 6. The fetal heartbeat will be listened to. 7. Any test results from the previous visit will be discussed. 8. You may have a cervical check near your due date to see if you have effaced. At around 36 weeks, your caregiver will check your cervix. At the same time, your caregiver will also perform a test on the secretions of the vaginal tissue. This test is to determine if a type of bacteria, Group B streptococcus, is present. Your caregiver will explain this further. Your caregiver may ask you: 2. What your birth plan is. 3. How you are feeling. 4. If you are feeling the baby move. 5. If you have had any abnormal symptoms, such as leaking fluid, bleeding, severe headaches, or abdominal cramping. 6. If you have any questions. Other tests or screenings that may be performed during your third trimester include: 2. Blood tests that check for low iron levels (anemia). 3. Fetal testing to check the health, activity level, and growth of the fetus. Testing is done if you have certain medical conditions or if there are problems during the pregnancy. FALSE LABOR You may feel small, irregular contractions that eventually go away. These are called Braxton Hicks contractions, or false labor. Contractions may last for hours, days, or even weeks before true labor sets in. If contractions come at regular intervals, intensify, or become painful, it is best to be seen by your caregiver.  SIGNS OF LABOR  3. Menstrual-like cramps. 4. Contractions that are 5 minutes apart or less. 5. Contractions that start on the top of the uterus and spread down to the lower abdomen and back. 6. A sense of increased pelvic pressure or back pain. 7. A watery or bloody mucus discharge that comes from the vagina. If you have any  of these signs before the 37th week of pregnancy, call your caregiver right away. You need to go to the hospital to get checked immediately. HOME CARE INSTRUCTIONS   Avoid all smoking, herbs, alcohol, and unprescribed drugs. These chemicals affect the formation and growth of the baby.  Follow your caregiver's instructions regarding medicine use. There are medicines that are either safe or unsafe to take during pregnancy.  Exercise only as directed by your caregiver. Experiencing uterine cramps is a good sign to stop exercising.  Continue to eat regular, healthy meals.  Wear a good support bra for breast tenderness.  Do not use hot tubs, steam rooms, or saunas.  Wear your seat belt at all times when driving.  Avoid raw meat, uncooked cheese, cat litter boxes, and soil used by cats. These carry germs that can cause birth   defects in the baby.  Take your prenatal vitamins.  Try taking a stool softener (if your caregiver approves) if you develop constipation. Eat more high-fiber foods, such as fresh vegetables or fruit and whole grains. Drink plenty of fluids to keep your urine clear or pale yellow.  Take warm sitz baths to soothe any pain or discomfort caused by hemorrhoids. Use hemorrhoid cream if your caregiver approves.  If you develop varicose veins, wear support hose. Elevate your feet for 15 minutes, 3-4 times a day. Limit salt in your diet.  Avoid heavy lifting, wear low heal shoes, and practice good posture.  Rest a lot with your legs elevated if you have leg cramps or low back pain.  Visit your dentist if you have not gone during your pregnancy. Use a soft toothbrush to brush your teeth and be gentle when you floss.  A sexual relationship may be continued unless your caregiver directs you otherwise.  Do not travel far distances unless it is absolutely necessary and only with the approval of your caregiver.  Take prenatal classes to understand, practice, and ask questions  about the labor and delivery.  Make a trial run to the hospital.  Pack your hospital bag.  Prepare the baby's nursery.  Continue to go to all your prenatal visits as directed by your caregiver. SEEK MEDICAL CARE IF:  You are unsure if you are in labor or if your water has broken.  You have dizziness.  You have mild pelvic cramps, pelvic pressure, or nagging pain in your abdominal area.  You have persistent nausea, vomiting, or diarrhea.  You have a bad smelling vaginal discharge.  You have pain with urination. SEEK IMMEDIATE MEDICAL CARE IF:   You have a fever.  You are leaking fluid from your vagina.  You have spotting or bleeding from your vagina.  You have severe abdominal cramping or pain.  You have rapid weight loss or gain.  You have shortness of breath with chest pain.  You notice sudden or extreme swelling of your face, hands, ankles, feet, or legs.  You have not felt your baby move in over an hour.  You have severe headaches that do not go away with medicine.  You have vision changes. Document Released: 08/09/2001 Document Revised: 08/20/2013 Document Reviewed: 10/16/2012 ExitCare Patient Information 2015 [employer]. This information is not intended to replace advice given to you by your health care provider. Make sure you discuss any questions you have with your health care provider.   

## 2014-12-25 NOTE — Progress Notes (Signed)
Low-risk OB appointment G3P0020 [id number] Estimated Date of Delivery: 03/14/15 BP 112/58 mmHg  Pulse 80  Wt 173 lb (78.472 kg)  BP, weight, and urine reviewed.  Refer to obstetrical flow sheet for FH & FHR.  Reports good fm.  Denies regular uc's, lof, vb, or uti s/s. [facility]/ [facility]- resolved on their own, cx was closed.  Very concerned about EICF, states after her last u/s here she went back to [place] and had a 2nd opinion u/s and they did not see EICF. However, on u/s here today it is still present. Discussed it can be a normal variation, or can be mild marker for DS- however her genetic screening was neg and there are no other markers for DS on u/s- so reassured likely normal variation. Still very concerned- gave printed info about EICF to take home and read.  Concerned b/c baby is measuring 30wks on u/s, wants to know if edc changes- discussed EDC will not change, normal to have some variation in measurements- will continue to monitor via FH during pregnancy  Reviewed ptl s/s, fkc. Recommended Tdap at HD/PCP per CDC guidelines.  Plan:  Continue routine obstetrical care  F/U in 4wks for OB appointment  PN2 today

## 2014-12-25 NOTE — Progress Notes (Signed)
US [redacted]W[redacted]D EFW 1654G 82.6%,cephalic,ant pl gr 1,cx 2.9,fht 133,EICF in lt vent,no obvious abn seen in outflow tracks,normal ov's bilat, afi 16cm

## 2014-12-26 LAB — CBC
HEMATOCRIT: 29.8 % — AB (ref 34.0–46.6)
Hemoglobin: 10.1 g/dL — ABNORMAL LOW (ref 11.1–15.9)
MCH: 30.2 pg (ref 26.6–33.0)
MCHC: 33.9 g/dL (ref 31.5–35.7)
MCV: 89 fL (ref 79–97)
Platelets: 207 10*3/uL (ref 150–379)
RBC: 3.34 x10E6/uL — ABNORMAL LOW (ref 3.77–5.28)
RDW: 13.5 % (ref 12.3–15.4)
WBC: 7.6 10*3/uL (ref 3.4–10.8)

## 2014-12-26 LAB — HSV 2 ANTIBODY, IGG

## 2014-12-26 LAB — GLUCOSE TOLERANCE, 2 HOURS W/ 1HR
GLUCOSE, 1 HOUR: 124 mg/dL (ref 65–179)
Glucose, 2 hour: 92 mg/dL (ref 65–152)
Glucose, Fasting: 74 mg/dL (ref 65–91)

## 2014-12-26 LAB — ANTIBODY SCREEN: ANTIBODY SCREEN: NEGATIVE

## 2014-12-26 LAB — HIV ANTIBODY (ROUTINE TESTING W REFLEX): HIV Screen 4th Generation wRfx: NONREACTIVE

## 2014-12-26 LAB — RPR: RPR Ser Ql: NONREACTIVE

## 2014-12-29 ENCOUNTER — Telehealth: Payer: Self-pay | Admitting: *Deleted

## 2014-12-29 NOTE — Telephone Encounter (Signed)
Spoke with pt letting her know her sugar test was normal. JSY

## 2015-01-07 ENCOUNTER — Telehealth: Payer: Self-pay | Admitting: *Deleted

## 2015-01-07 NOTE — Telephone Encounter (Signed)
Pt states she has not felt the baby move since this am. Pt encouraged to eat and lay down on side and do "kick count"  (10 FM in 2 hour) if unable to feel the baby move 10 times in 2 hours will need to go to MAU to be evaluated for decreased FM. Pt also asked was it safe at 30 weeks to have a bubble bath. Per Cathie BeamsFran Cresenzo-Dishmon, CNM OK to take bubble baths. Pt verbalized understanding.

## 2015-01-08 ENCOUNTER — Encounter (HOSPITAL_COMMUNITY): Payer: Self-pay | Admitting: *Deleted

## 2015-01-08 ENCOUNTER — Inpatient Hospital Stay (HOSPITAL_COMMUNITY)
Admission: AD | Admit: 2015-01-08 | Discharge: 2015-01-08 | Disposition: A | Discharge status: Home / Self Care | Payer: Medicaid Other | Source: Ambulatory Visit | Attending: Obstetrics & Gynecology | Admitting: Obstetrics & Gynecology

## 2015-01-08 DIAGNOSIS — O4703 False labor before 37 completed weeks of gestation, third trimester: Secondary | ICD-10-CM | POA: Diagnosis not present

## 2015-01-08 DIAGNOSIS — Z3A30 30 weeks gestation of pregnancy: Secondary | ICD-10-CM | POA: Diagnosis not present

## 2015-01-08 LAB — URINALYSIS, ROUTINE W REFLEX MICROSCOPIC
Bilirubin Urine: NEGATIVE
GLUCOSE, UA: NEGATIVE mg/dL
HGB URINE DIPSTICK: NEGATIVE
Ketones, ur: NEGATIVE mg/dL
Nitrite: NEGATIVE
Protein, ur: NEGATIVE mg/dL
Specific Gravity, Urine: 1.01 (ref 1.005–1.030)
Urobilinogen, UA: 0.2 mg/dL (ref 0.0–1.0)
pH: 6.5 (ref 5.0–8.0)

## 2015-01-08 LAB — URINE MICROSCOPIC-ADD ON

## 2015-01-08 LAB — FETAL FIBRONECTIN: FETAL FIBRONECTIN: NEGATIVE

## 2015-01-08 NOTE — Discharge Instructions (Signed)
Braxton Hicks Contractions °Contractions of the uterus can occur throughout pregnancy. Contractions are not always a sign that you are in labor.  °WHAT ARE BRAXTON HICKS CONTRACTIONS?  °Contractions that occur before labor are called Braxton Hicks contractions, or false labor. Toward the end of pregnancy (32-34 weeks), these contractions can develop more often and may become more forceful. This is not true labor because these contractions do not result in opening (dilatation) and thinning of the cervix. They are sometimes difficult to tell apart from true labor because these contractions can be forceful and people have different pain tolerances. You should not feel embarrassed if you go to the hospital with false labor. Sometimes, the only way to tell if you are in true labor is for your health care provider to look for changes in the cervix. °If there are no prenatal problems or other health problems associated with the pregnancy, it is completely safe to be sent home with false labor and await the onset of true labor. °HOW CAN YOU TELL THE DIFFERENCE BETWEEN TRUE AND FALSE LABOR? °False Labor °· The contractions of false labor are usually shorter and not as hard as those of true labor.   °· The contractions are usually irregular.   °· The contractions are often felt in the front of the lower abdomen and in the groin.   °· The contractions may go away when you walk around or change positions while lying down.   °· The contractions get weaker and are shorter lasting as time goes on.   °· The contractions do not usually become progressively stronger, regular, and closer together as with true labor.   °True Labor °· Contractions in true labor last 30-70 seconds, become very regular, usually become more intense, and increase in frequency.   °· The contractions do not go away with walking.   °· The discomfort is usually felt in the top of the uterus and spreads to the lower abdomen and low back.   °· True labor can be  determined by your health care provider with an exam. This will show that the cervix is dilating and getting thinner.   °WHAT TO REMEMBER °· Keep up with your usual exercises and follow other instructions given by your health care provider.   °· Take medicines as directed by your health care provider.   °· Keep your regular prenatal appointments.   °· Eat and drink lightly if you think you are going into labor.   °· If Braxton Hicks contractions are making you uncomfortable:   °¨ Change your position from lying down or resting to walking, or from walking to resting.   °¨ Sit and rest in a tub of warm water.   °¨ Drink 2-3 glasses of water. Dehydration may cause these contractions.   °¨ Do slow and deep breathing several times an hour.   °WHEN SHOULD I SEEK IMMEDIATE MEDICAL CARE? °Seek immediate medical care if: °· Your contractions become stronger, more regular, and closer together.   °· You have fluid leaking or gushing from your vagina.   °· You have a fever.   °· You pass blood-tinged mucus.   °· You have vaginal bleeding.   °· You have continuous abdominal pain.   °· You have low back pain that you never had before.   °· You feel your baby's head pushing down and causing pelvic pressure.   °· Your baby is not moving as much as it used to.   °Document Released: 08/15/2005 Document Revised: 08/20/2013 Document Reviewed: 05/27/2013 °ExitCare® Patient Information ©2015 [employer]. This information is not intended to replace advice given to you by your health care   provider. Make sure you discuss any questions you have with your health care provider. ° °

## 2015-01-08 NOTE — MAU Provider Note (Signed)
None     Chief Complaint:  Contractions and decreased FM  GrenadaBrittany Fayne MediateHiatt Eisinger is  19 y.o. G3P0020 at 2777w5d presents complaining of contractions.  She states she was having egular, every 5-6 minutes contractions are associated with none vaginal bleeding, intact membranes, along with decreased  fetal movement. Fetus is now active.  She states that ctx stopped when she got to MAU  Obstetrical/Gynecological History: OB History    Gravida Para Term Preterm AB TAB SAB Ectopic Multiple Living   3    2          Past Medical History: Past Medical History  Diagnosis Date  . Irritable bowel syndrome   . Cystic fibroadenosis of breast     Past Surgical History: Past Surgical History  Procedure Laterality Date  . No past surgeries      Family History: Family History  Problem Relation Age of Onset  . Cystic fibrosis Mother   . Autism Brother     Social History: History  Substance Use Topics  . Smoking status: Current Every Day Smoker -- 0.50 packs/day  . Smokeless tobacco: Never Used  . Alcohol Use: No    Allergies: No Known Allergies  Meds:  Prescriptions prior to admission  Medication Sig Dispense Refill Last Dose  . acetaminophen (TYLENOL) 500 MG tablet Take 1,000 mg by mouth every 6 (six) hours as needed for mild pain.   01/08/2015 at Unknown time  . Ca Carbonate-Mag Hydroxide (ROLAIDS PO) Take 1-2 tablets by mouth as needed (heartburn).   01/07/2015 at Unknown time  . flintstones complete (FLINTSTONES) 60 MG chewable tablet Chew 2 tablets by mouth daily.   01/08/2015 at Unknown time  . omeprazole (PRILOSEC) 20 MG capsule Take 20 mg by mouth daily.   Past Week at Unknown time    Review of Systems   Constitutional: Negative for fever and chills Eyes: Negative for visual disturbances Respiratory: Negative for shortness of breath, dyspnea Cardiovascular: Negative for chest pain or palpitations  Gastrointestinal: Negative for vomiting, diarrhea and  constipation Genitourinary: Negative for dysuria and urgency Musculoskeletal: Negative for back pain, joint pain, myalgias.  Normal ROM  Neurological: Negative for dizziness and headaches    Physical Exam  There were no vitals taken for this visit. GENERAL: Well-developed, well-nourished female in no acute distress.  LUNGS: Clear to auscultation bilaterally.  HEART: Regular rate and rhythm. ABDOMEN: Soft, nontender, nondistended, gravid.  EXTREMITIES: Nontender, no edema, 2+ distal pulses. DTR's 2+ CERVICAL EXAM: Dilatation 0cm   Effacement 0%   Station out of pelvis   Presentation: cephalic FHT:  Baseline rate 150 bpm   Variability moderate  Accelerations present   Decelerations none Contractions: Every 0 mins   Labs: Results for orders placed or performed during the hospital encounter of 01/08/15 (from the past 24 hour(s))  Urinalysis, Routine w reflex microscopic   Collection Time: 01/08/15  9:30 PM  Result Value Ref Range   Color, Urine YELLOW YELLOW   APPearance CLEAR CLEAR   Specific Gravity, Urine 1.010 1.005 - 1.030   pH 6.5 5.0 - 8.0   Glucose, UA NEGATIVE NEGATIVE mg/dL   Hgb urine dipstick NEGATIVE NEGATIVE   Bilirubin Urine NEGATIVE NEGATIVE   Ketones, ur NEGATIVE NEGATIVE mg/dL   Protein, ur NEGATIVE NEGATIVE mg/dL   Urobilinogen, UA 0.2 0.0 - 1.0 mg/dL   Nitrite NEGATIVE NEGATIVE   Leukocytes, UA TRACE (A) NEGATIVE  Urine microscopic-add on   Collection Time: 01/08/15  9:30 PM  Result Value Ref  Range   Squamous Epithelial / LPF FEW (A) RARE   WBC, UA 3-6 <3 WBC/hpf   Bacteria, UA FEW (A) RARE   Urine-Other MUCOUS PRESENT   Fetal fibronectin   Collection Time: 01/08/15 10:12 PM  Result Value Ref Range   Fetal Fibronectin NEGATIVE NEGATIVE   Imaging Studies:    Assessment: Mattie MarlinBrittany Hiatt Polich is  19 y.o. G3P0020 at 723w5d presents with false labor.  Plan: PTL precautions. DC home  CRESENZO-DISHMAN,FRANCES 5/12/201611:12 PM  Attestation of  Attending Supervision of Advanced Practitioner (PA/CNM/NP): Evaluation and management procedures were performed by the Advanced Practitioner under my supervision and collaboration.  I have reviewed the Advanced Practitioner's note and chart, and I agree with the management and plan.  Jaynie CollinsUGONNA  Alani Sabbagh, MD, FACOG Attending Obstetrician & Gynecologist Faculty Practice, [facility]

## 2015-01-08 NOTE — MAU Note (Signed)
Pt reports she is having contractions, decreased fetal movement yesterday. Denies bleeding or ROM

## 2015-01-19 ENCOUNTER — Encounter: Payer: Self-pay | Admitting: Advanced Practice Midwife

## 2015-01-19 ENCOUNTER — Encounter: Payer: BC Managed Care – PPO | Admitting: Advanced Practice Midwife

## 2015-01-21 ENCOUNTER — Ambulatory Visit (INDEPENDENT_AMBULATORY_CARE_PROVIDER_SITE_OTHER): Payer: BC Managed Care – PPO | Admitting: Women's Health

## 2015-01-21 ENCOUNTER — Telehealth: Payer: Self-pay | Admitting: *Deleted

## 2015-01-21 ENCOUNTER — Encounter: Payer: Self-pay | Admitting: Women's Health

## 2015-01-21 VITALS — BP 90/60 | HR 80 | Wt 173.0 lb

## 2015-01-21 DIAGNOSIS — O2340 Unspecified infection of urinary tract in pregnancy, unspecified trimester: Secondary | ICD-10-CM | POA: Insufficient documentation

## 2015-01-21 DIAGNOSIS — Z331 Pregnant state, incidental: Secondary | ICD-10-CM

## 2015-01-21 DIAGNOSIS — M545 Low back pain, unspecified: Secondary | ICD-10-CM

## 2015-01-21 DIAGNOSIS — Z1389 Encounter for screening for other disorder: Secondary | ICD-10-CM

## 2015-01-21 DIAGNOSIS — K429 Umbilical hernia without obstruction or gangrene: Secondary | ICD-10-CM | POA: Insufficient documentation

## 2015-01-21 DIAGNOSIS — O26893 Other specified pregnancy related conditions, third trimester: Secondary | ICD-10-CM

## 2015-01-21 DIAGNOSIS — Z3493 Encounter for supervision of normal pregnancy, unspecified, third trimester: Secondary | ICD-10-CM

## 2015-01-21 DIAGNOSIS — O2343 Unspecified infection of urinary tract in pregnancy, third trimester: Secondary | ICD-10-CM

## 2015-01-21 LAB — POCT URINALYSIS DIPSTICK
Blood, UA: NEGATIVE
GLUCOSE UA: NEGATIVE
Ketones, UA: NEGATIVE
Leukocytes, UA: NEGATIVE
NITRITE UA: POSITIVE
PROTEIN UA: NEGATIVE

## 2015-01-21 MED ORDER — NITROFURANTOIN MONOHYD MACRO 100 MG PO CAPS: 100.0000 mg | ORAL_CAPSULE | Freq: Two times a day (BID) | ORAL | Status: DC

## 2015-01-21 MED ORDER — FERROUS SULFATE 325 (65 FE) MG PO TABS: 325.0000 mg | ORAL_TABLET | Freq: Two times a day (BID) | ORAL | Status: DC

## 2015-01-21 NOTE — Progress Notes (Signed)
Low-risk OB appointment G3P0020 4517w4d Estimated Date of Delivery: 03/14/15 BP 90/60 mmHg  Pulse 80  Wt 173 lb (78.472 kg)  BP, weight, and urine reviewed.  Refer to obstetrical flow sheet for FH & FHR.  Reports good fm.  Denies regular uc's, lof, vb, or uti s/s. Low back/hip pain, umbilical pain.  Urinates a lot in am and has bad odor, then clears up during the day. + nitrates today, rx macrobid and send urine for cx. Discussed LBP relief measures.  Small reducible umbilical hernia- discussed relief measures and s/s incarceration.  Reviewed pn2 results, anemic- rx fe bid, states sometimes gives her migraines- discussed increasing fe in foods. REviewed ptl s/s, fkc.  Plan:  Continue routine obstetrical care  F/U in 2wks for OB appointment

## 2015-01-21 NOTE — Telephone Encounter (Signed)
Pt called stating that her dentist wants to do four fillings on her at one time and that they want to do this a week or so before she is due. I advised the pt that she may want to wait until after delivery to have the dental work done but she then stated that one of the teeth is really bothering her. I advised the pt that I would send this message to a provider and see what they advise her do. Pt verbalized understanding.

## 2015-01-21 NOTE — Addendum Note (Signed)
Addended by: Colen DarlingYOUNG, Lamanda Rudder S on: 01/21/2015 09:50 AM   Modules accepted: Orders

## 2015-01-21 NOTE — Patient Instructions (Addendum)
Call the office [phone]) or go to [facility] if:  You begin to have strong, frequent contractions  Your water breaks.  Sometimes it is a big gush of fluid, sometimes it is just a trickle that keeps getting your panties wet or running down your legs  You have vaginal bleeding.  It is normal to have a small amount of spotting if your cervix was checked.   You don't feel your baby moving like normal.  If you don't, get you something to eat and drink and lay down and focus on feeling your baby move.  You should feel at least 10 movements in 2 hours.  If you don't, you should call the office or go to [facility].    Tips to Help Leg Cramps  Increase dietary sources of calcium (milk, yogurt, cheese, leafy greens, seafood, legumes, and fruit) and magnesium (dark leafy greens, nuts, seeds, fish, beans, whole grains, avocados, yogurt, bananas, dried fruit, dark chocolate)  Spoonful of regular yellow mustard every night  Magnesium supplement: in the morning, at night (can find in the vitamin aisle)  Dorsiflexion of foot: pointing your toes back towards your knee during the cramp      For your lower back pain you may:  Purchase a pregnancy belt from [employer], [employer], [employer], etc and wear it while you are up and about  Take warm baths  Use a heating pad to your lower back for no longer than 20 minutes at a time, and do not place near abdomen  Take tylenol as needed. Please follow directions on the bottle  Preterm Labor Information Preterm labor is when labor starts at less than 37 weeks of pregnancy. The normal length of a pregnancy is 39 to 41 weeks. CAUSES Often, there is no identifiable underlying cause as to why a woman goes into preterm labor. One of the most common known causes of preterm labor is infection. Infections of the uterus, cervix, vagina, amniotic sac, bladder, kidney, or even the lungs (pneumonia) can cause labor to start. Other  suspected causes of preterm labor include:  6. Urogenital infections, such as yeast infections and bacterial vaginosis.  7. Uterine abnormalities (uterine shape, uterine septum, fibroids, or bleeding from the placenta).  8. A cervix that has been operated on (it may fail to stay closed).  9. Malformations in the fetus.  10. Multiple gestations (twins, triplets, and so on).  11. Breakage of the amniotic sac.  RISK FACTORS  Having a previous history of preterm labor.   Having premature rupture of membranes (PROM).   Having a placenta that covers the opening of the cervix (placenta previa).   Having a placenta that separates from the uterus (placental abruption).   Having a cervix that is too weak to hold the fetus in the uterus (incompetent cervix).   Having too much fluid in the amniotic sac (polyhydramnios).   Taking illegal drugs or smoking while pregnant.   Not gaining enough weight while pregnant.   Being younger than 8 and older than 21 years old.   Having a low socioeconomic status.   Being African American. SYMPTOMS Signs and symptoms of preterm labor include:  4. Menstrual-like cramps, abdominal pain, or back pain. 5. Uterine contractions that are regular, as frequent as six in an hour, regardless of their intensity (may be mild or painful). 6. Contractions that start on the top of the uterus and spread down to the lower abdomen and back.  7. A  sense of increased pelvic pressure.  8. A watery or bloody mucus discharge that comes from the vagina.  TREATMENT Depending on the length of the pregnancy and other circumstances, your health care provider may suggest bed rest. If necessary, there are medicines that can be given to stop contractions and to mature the fetal lungs. If labor happens before 34 weeks of pregnancy, a prolonged hospital stay may be recommended. Treatment depends on the condition of both you and the fetus.  WHAT SHOULD YOU DO IF YOU  THINK YOU ARE IN PRETERM LABOR? Call your health care provider right away. You will need to go to the hospital to get checked immediately. HOW CAN YOU PREVENT PRETERM LABOR IN FUTURE PREGNANCIES? You should:   Stop smoking if you smoke.  Maintain healthy weight gain and avoid chemicals and drugs that are not necessary.  Be watchful for any type of infection.  Inform your health care provider if you have a known history of preterm labor. Document Released: 11/05/2003 Document Revised: 04/17/2013 Document Reviewed: 09/17/2012 [facility] Patient Information 2015 [place]. This information is not intended to replace advice given to you by your health care provider. Make sure you discuss any questions you have with your health care provider.  Iron-Rich Diet An iron-rich diet contains foods that are good sources of iron. Iron is an important mineral that helps your body produce hemoglobin. Hemoglobin is a protein in red blood cells that carries oxygen to the body's tissues. Sometimes, the iron level in your blood can be low. This may be caused by: 12. A lack of iron in your diet. 13. Blood loss. 14. Times of growth, such as during pregnancy or during a child's growth and development. Low levels of iron can cause a decrease in the number of red blood cells. This can result in iron deficiency anemia. Iron deficiency anemia symptoms include:  Tiredness.  Weakness.  Irritability.  Increased chance of infection. Here are some recommendations for daily iron intake: 9. Males older than 21 years of age need 8 mg of iron per day. 10. Women ages 2519 to 6550 need 18 mg of iron per day. 11. Pregnant women need 27 mg of iron per day, and women who are over 21 years of age and breastfeeding need 9 mg of iron per day. 12. Women over the age of 21 need 8 mg of iron per day. SOURCES OF IRON There are 2 types of iron that are found in food: heme iron and nonheme iron. Heme iron is absorbed by the body  better than nonheme iron. Heme iron is found in meat, poultry, and fish. Nonheme iron is found in grains, beans, and vegetables. Heme Iron Sources Food / Iron (mg)  Chicken liver, 3 oz (85 g)/ 10 mg  Beef liver, 3 oz (85 g)/ 5.5 mg  Oysters, 3 oz (85 g)/ 8 mg  Beef, 3 oz (85 g)/ 2 to 3 mg  Shrimp, 3 oz (85 g)/ 2.8 mg  [place], 3 oz (85 g)/ 2 mg  Chicken, 3 oz (85 g) / 1 mg  Fish (tuna, halibut), 3 oz (85 g)/ 1 mg  Pork, 3 oz (85 g)/ 0.9 mg Nonheme Iron Sources Food / Iron (mg) 5. Ready-to-eat breakfast cereal, iron-fortified / 3.9 to 7 mg 6. Tofu,  cup / 3.4 mg 7. Kidney beans,  cup / 2.6 mg 8. Baked potato with skin / 2.7 mg 9. Asparagus,  cup / 2.2 mg 10. Avocado / 2 mg 11. Dried  peaches,  cup / 1.6 mg 12. Raisins,  cup / 1.5 mg 13. Soy milk, 1 cup / 1.5 mg 14. Whole-wheat bread, 1 slice / 1.2 mg 15. Spinach, 1 cup / 0.8 mg 16. Broccoli,  cup / 0.6 mg IRON ABSORPTION Certain foods can decrease the body's absorption of iron. Try to avoid these foods and beverages while eating meals with iron-containing foods:  Coffee.  Tea.  Fiber.  Soy. Foods containing vitamin C can help increase the amount of iron your body absorbs from iron sources, especially from nonheme sources. Eat foods with vitamin C along with iron-containing foods to increase your iron absorption. Foods that are high in vitamin C include many fruits and vegetables. Some good sources are:  Fresh orange juice.  Oranges.  Strawberries.  Mangoes.  Grapefruit.  Red bell peppers.  Green bell peppers.  Broccoli.  Potatoes with skin.  Tomato juice. Document Released: 03/29/2005 Document Revised: 11/07/2011 Document Reviewed: 02/03/2011 [facility] Patient Information 2015 [place], [place]. This information is not intended to replace advice given to you by your health care provider. Make sure you discuss any questions you have with your health care provider.

## 2015-01-22 NOTE — Telephone Encounter (Signed)
LM with pt that she may have her dental work done and we have a dental form up front if her dentist wants it

## 2015-01-24 LAB — URINE CULTURE

## 2015-01-27 ENCOUNTER — Telehealth: Payer: Self-pay | Admitting: Advanced Practice Midwife

## 2015-01-27 NOTE — Telephone Encounter (Signed)
Pt asking what her urine culture results mean? Pt informed urine culture from 01/21/2015 showed E Coli and to continue the Macrobid. Pt verbalized understanding.

## 2015-02-01 ENCOUNTER — Encounter: Payer: Self-pay | Admitting: Advanced Practice Midwife

## 2015-02-02 ENCOUNTER — Inpatient Hospital Stay (HOSPITAL_COMMUNITY)
Admission: AD | Admit: 2015-02-02 | Discharge: 2015-02-02 | Disposition: A | Discharge status: Home / Self Care | Payer: Medicaid Other | Source: Ambulatory Visit | Attending: Family Medicine | Admitting: Family Medicine

## 2015-02-02 ENCOUNTER — Telehealth: Payer: Self-pay | Admitting: Obstetrics & Gynecology

## 2015-02-02 DIAGNOSIS — O99333 Smoking (tobacco) complicating pregnancy, third trimester: Secondary | ICD-10-CM | POA: Insufficient documentation

## 2015-02-02 DIAGNOSIS — Z3A34 34 weeks gestation of pregnancy: Secondary | ICD-10-CM | POA: Diagnosis not present

## 2015-02-02 DIAGNOSIS — N898 Other specified noninflammatory disorders of vagina: Secondary | ICD-10-CM | POA: Insufficient documentation

## 2015-02-02 DIAGNOSIS — O9989 Other specified diseases and conditions complicating pregnancy, childbirth and the puerperium: Secondary | ICD-10-CM | POA: Insufficient documentation

## 2015-02-02 DIAGNOSIS — O26893 Other specified pregnancy related conditions, third trimester: Secondary | ICD-10-CM

## 2015-02-02 LAB — URINALYSIS, ROUTINE W REFLEX MICROSCOPIC
Bilirubin Urine: NEGATIVE
Glucose, UA: NEGATIVE mg/dL
HGB URINE DIPSTICK: NEGATIVE
Ketones, ur: NEGATIVE mg/dL
LEUKOCYTES UA: NEGATIVE
NITRITE: NEGATIVE
PH: 6 (ref 5.0–8.0)
PROTEIN: NEGATIVE mg/dL
Specific Gravity, Urine: 1.01 (ref 1.005–1.030)
UROBILINOGEN UA: 0.2 mg/dL (ref 0.0–1.0)

## 2015-02-02 LAB — POCT FERN TEST: POCT Fern Test: NEGATIVE

## 2015-02-02 NOTE — MAU Provider Note (Signed)
History   CSN: [id number]  Arrival date and time: 02/02/15 1941  Chief Complaint  Patient presents with  . Rupture of Membranes    leaking fluid, here for r/o ROM   HPI   Patient is 19 y.o. [id number] [redacted]w[redacted]d here with complaints of leakage of fluid. Patient was concerned that she may have had ROM. States that she started having frequent contractions last night but they were irregular and greater than 10 minutes apart. She believes they were Braxton-Hicks contractions. She states that these contractions were off and on all night and started again this morning. This morning she states she had a gush of fluid that made her wet her panties but not pants.  She denies any continues leakage but states she has had one more episode of fluid leaking that was not as much as prior. Fluid is not colored and has odor. States it is clear and thin. Called her OB office who told her to come in to be evaluated. Patient denies sex in the past week. Of note, patient just finished Abx for UTI.    +FM, denies VB, vaginal discharge.   OB History    Gravida Para Term Preterm AB TAB SAB Ectopic Multiple Living   3    2         -Receives prenatal care at [facility]  Past Medical History  Diagnosis Date  . Irritable bowel syndrome   . Cystic fibroadenosis of breast     Past Surgical History  Procedure Laterality Date  . No past surgeries      Family History  Problem Relation Age of Onset  . Cystic fibrosis Mother   . Autism Brother     History  Substance Use Topics  . Smoking status: Current Every Day Smoker -- 0.50 packs/day  . Smokeless tobacco: Never Used  . Alcohol Use: No    Allergies: No Known Allergies  Prescriptions prior to admission  Medication Sig Dispense Refill Last Dose  . acetaminophen (TYLENOL) 500 MG tablet Take 500 mg by mouth every 6 (six) hours as needed for mild pain.    Past Week at Unknown time  . Ca Carbonate-Mag Hydroxide (ROLAIDS PO) Take 1-2 tablets by mouth as needed  (heartburn).   Past Week at Unknown time  . ferrous sulfate 325 (65 FE) MG tablet Take 1 tablet (325 mg total) by mouth 2 (two) times daily with a meal. 60 tablet 3  at    . flintstones complete (FLINTSTONES) 60 MG chewable tablet Chew 2 tablets by mouth daily.   02/01/2015 at Unknown time  . nitrofurantoin, macrocrystal-monohydrate, (MACROBID) 100 MG capsule Take 1 capsule (100 mg total) by mouth 2 (two) times daily. X 7 days 14 capsule 0 Past Week at Unknown time  . omeprazole (PRILOSEC) 20 MG capsule Take 20 mg by mouth daily as needed (for heartburn).    Past Month at Unknown time    Review of Systems  Constitutional: Negative for fever and chills.  Eyes: Negative for blurred vision and double vision.  Respiratory: Negative for shortness of breath.   Cardiovascular: Negative for chest pain and leg swelling.  Gastrointestinal: Positive for nausea. Negative for vomiting and constipation.  Genitourinary: Negative for dysuria.  Neurological: Positive for dizziness. Negative for headaches.   Physical Exam   Blood pressure 112/72, pulse 103, temperature 98.2 F (36.8 C), temperature source Oral, resp. rate 18, height  (1.6 m), weight 163 lb (73.936 kg), SpO2 98 %.  Physical Exam  Constitutional:  She is oriented to person, place, and time. She appears well-developed and well-nourished. No distress.  HENT:  Head: Normocephalic and atraumatic.  Eyes: EOM are normal.  Cardiovascular: Normal rate, regular rhythm, normal heart sounds and intact distal pulses.   Respiratory: Effort normal and breath sounds normal.  GI: Soft. There is no tenderness.  gravid  Genitourinary: Vagina normal.  Musculoskeletal: Normal range of motion. She exhibits no edema or tenderness.  Neurological: She is alert and oriented to person, place, and time.  Skin: Skin is warm and dry.   MAU Course  Procedures - None  MDM: - NST reactive and reassuring - SPE without pooling; minimal discharge - Fern  negative - SVE with closed/thick/ high cervix  Assessment and Plan  A: Patient is 19 y.o. [id number] 7657w2d reporting leakage of fluid likely secondary to mucous plug vs increased vaginal discharge. R/o ROM with no vaginal vault pooling and negative fern.   P: - patient reassured - fetal kick counts reinforced - preterm labor precautions -handout given  Caryl AdaJazma Phelps, DO 02/02/2015, 10:18 PM PGY-1, [facility]  OB fellow attestation:  I have seen and examined this patient; I agree with above documentation in the resident's note.   GrenadaBrittany Fayne MediateHiatt Memmott is a 19 y.o. G3P0020 reporting leakage of fluid +FM, VB, contractions, vaginal discharge.  PE: BP 110/61 mmHg  Pulse 97  Temp(Src) 98.1 F (36.7 C) (Oral)  Resp 18  Ht 5\' 3"  (1.6 m)  Wt 163 lb (73.936 kg)  BMI 28.88 kg/m2  SpO2 98% Gen: calm comfortable, NAD Resp: normal effort, no distress Abd: gravid  ROS, labs, PMH reviewed NST reactive   Plan: #Rule out ROM - SSE performed with the resident; neg pooling; neg ferning - SVE cl/th/hi  No evidence of preterm labor.  - fetal kick counts reinforced, preterm labor precautions - continue routine follow up in OB clinic  William DaltonMcEachern, Ismaeel Arvelo, MD 10:47 PM

## 2015-02-02 NOTE — Telephone Encounter (Signed)
Spoke with pt. Pt states she lost some of her mucus plug yesterday. + contractions yesterday, 8 minutes apart then 5 minutes apart, then they stopped. Contractions came back late last pm at 2 minutes apart. Pt felt lightheaded and dizzy. Contractions today are more irregular, but stomach feels tight. The dizziness and lightheadedness has returned. Decreased fetal movement and pt states she is wet in her panties when she goes to the bathroom. [facility] Pt voiced understanding. JSY

## 2015-02-02 NOTE — MAU Note (Signed)
Pt states she called the office today because she has been having contractions off/on since last pm. States she may be leaking some fluid. Was told to come in and be checked.

## 2015-02-04 ENCOUNTER — Ambulatory Visit (INDEPENDENT_AMBULATORY_CARE_PROVIDER_SITE_OTHER): Payer: BC Managed Care – PPO | Admitting: Advanced Practice Midwife

## 2015-02-04 VITALS — BP 102/54 | HR 95 | Wt 169.0 lb

## 2015-02-04 DIAGNOSIS — Z1389 Encounter for screening for other disorder: Secondary | ICD-10-CM

## 2015-02-04 DIAGNOSIS — Z331 Pregnant state, incidental: Secondary | ICD-10-CM

## 2015-02-04 DIAGNOSIS — Z3493 Encounter for supervision of normal pregnancy, unspecified, third trimester: Secondary | ICD-10-CM

## 2015-02-04 LAB — POCT URINALYSIS DIPSTICK
Blood, UA: NEGATIVE
Glucose, UA: NEGATIVE
Ketones, UA: NEGATIVE
Leukocytes, UA: NEGATIVE
Nitrite, UA: NEGATIVE
Protein, UA: NEGATIVE

## 2015-02-04 NOTE — Progress Notes (Signed)
[id number] 402w4d Estimated Date of Delivery: 03/14/15  Blood pressure 102/54, pulse 95, weight 169 lb (76.658 kg).   BP weight and urine results all reviewed and noted.  Please refer to the obstetrical flow sheet for the fundal height and fetal heart rate documentation:  Patient reports good fetal movement, denies any bleeding and no rupture of membranes symptoms or regular contractions. Patient is without complaints. All questions were answered.  Plan:  Continued routine obstetrical care,   Follow up in 2 weeks for OB appointment,

## 2015-02-12 ENCOUNTER — Encounter (HOSPITAL_COMMUNITY): Payer: Self-pay

## 2015-02-12 ENCOUNTER — Inpatient Hospital Stay (HOSPITAL_COMMUNITY)
Admission: AD | Admit: 2015-02-12 | Discharge: 2015-02-13 | Disposition: A | Discharge status: Home / Self Care | Payer: Medicaid Other | Source: Ambulatory Visit | Attending: Obstetrics & Gynecology | Admitting: Obstetrics & Gynecology

## 2015-02-12 DIAGNOSIS — Z3A35 35 weeks gestation of pregnancy: Secondary | ICD-10-CM | POA: Insufficient documentation

## 2015-02-12 DIAGNOSIS — O479 False labor, unspecified: Secondary | ICD-10-CM

## 2015-02-12 DIAGNOSIS — O99333 Smoking (tobacco) complicating pregnancy, third trimester: Secondary | ICD-10-CM | POA: Insufficient documentation

## 2015-02-12 DIAGNOSIS — O4703 False labor before 37 completed weeks of gestation, third trimester: Secondary | ICD-10-CM | POA: Insufficient documentation

## 2015-02-13 DIAGNOSIS — Z3A35 35 weeks gestation of pregnancy: Secondary | ICD-10-CM | POA: Diagnosis not present

## 2015-02-13 DIAGNOSIS — O99333 Smoking (tobacco) complicating pregnancy, third trimester: Secondary | ICD-10-CM | POA: Diagnosis not present

## 2015-02-13 DIAGNOSIS — O479 False labor, unspecified: Secondary | ICD-10-CM

## 2015-02-13 DIAGNOSIS — O4703 False labor before 37 completed weeks of gestation, third trimester: Secondary | ICD-10-CM | POA: Diagnosis not present

## 2015-02-13 NOTE — Discharge Instructions (Signed)
Braxton Hicks Contractions °Contractions of the uterus can occur throughout pregnancy. Contractions are not always a sign that you are in labor.  °WHAT ARE BRAXTON HICKS CONTRACTIONS?  °Contractions that occur before labor are called Braxton Hicks contractions, or false labor. Toward the end of pregnancy (32-34 weeks), these contractions can develop more often and may become more forceful. This is not true labor because these contractions do not result in opening (dilatation) and thinning of the cervix. They are sometimes difficult to tell apart from true labor because these contractions can be forceful and people have different pain tolerances. You should not feel embarrassed if you go to the hospital with false labor. Sometimes, the only way to tell if you are in true labor is for your health care provider to look for changes in the cervix. °If there are no prenatal problems or other health problems associated with the pregnancy, it is completely safe to be sent home with false labor and await the onset of true labor. °HOW CAN YOU TELL THE DIFFERENCE BETWEEN TRUE AND FALSE LABOR? °False Labor °· The contractions of false labor are usually shorter and not as hard as those of true labor.   °· The contractions are usually irregular.   °· The contractions are often felt in the front of the lower abdomen and in the groin.   °· The contractions may go away when you walk around or change positions while lying down.   °· The contractions get weaker and are shorter lasting as time goes on.   °· The contractions do not usually become progressively stronger, regular, and closer together as with true labor.   °True Labor °· Contractions in true labor last 30-70 seconds, become very regular, usually become more intense, and increase in frequency.   °· The contractions do not go away with walking.   °· The discomfort is usually felt in the top of the uterus and spreads to the lower abdomen and low back.   °· True labor can be  determined by your health care provider with an exam. This will show that the cervix is dilating and getting thinner.   °WHAT TO REMEMBER °· Keep up with your usual exercises and follow other instructions given by your health care provider.   °· Take medicines as directed by your health care provider.   °· Keep your regular prenatal appointments.   °· Eat and drink lightly if you think you are going into labor.   °· If Braxton Hicks contractions are making you uncomfortable:   °¨ Change your position from lying down or resting to walking, or from walking to resting.   °¨ Sit and rest in a tub of warm water.   °¨ Drink 2-3 glasses of water. Dehydration may cause these contractions.   °¨ Do slow and deep breathing several times an hour.   °WHEN SHOULD I SEEK IMMEDIATE MEDICAL CARE? °Seek immediate medical care if: °· Your contractions become stronger, more regular, and closer together.   °· You have fluid leaking or gushing from your vagina.   °· You have a fever.   °· You pass blood-tinged mucus.   °· You have vaginal bleeding.   °· You have continuous abdominal pain.   °· You have low back pain that you never had before.   °· You feel your baby's head pushing down and causing pelvic pressure.   °· Your baby is not moving as much as it used to.   °Document Released: 08/15/2005 Document Revised: 08/20/2013 Document Reviewed: 05/27/2013 °ExitCare® Patient Information ©2015 [employer]. This information is not intended to replace advice given to you by your health care   provider. Make sure you discuss any questions you have with your health care provider. ° °

## 2015-02-13 NOTE — MAU Provider Note (Signed)
None     Chief Complaint:  Contractions   Kristina Robbins is  19 y.o. G3P0020 at [redacted]w[redacted]d presents complaining of Contractions .  She started around 1900, but now the "are not nearly as bad as they were.": She states irregular contractions are associated with no vaginal bleeding, intact membranes, along with active fetal movement.   Obstetrical/Gynecological History: OB History    Gravida Para Term Preterm AB TAB SAB Ectopic Multiple Living   3    2          Past Medical History: Past Medical History  Diagnosis Date  . Irritable bowel syndrome   . Cystic fibroadenosis of breast     Past Surgical History: Past Surgical History  Procedure Laterality Date  . No past surgeries      Family History: Family History  Problem Relation Age of Onset  . Cystic fibrosis Mother   . Autism Brother     Social History: History  Substance Use Topics  . Smoking status: Current Every Day Smoker -- 0.50 packs/day  . Smokeless tobacco: Never Used  . Alcohol Use: No    Allergies: No Known Allergies  Meds:  Prescriptions prior to admission  Medication Sig Dispense Refill Last Dose  . acetaminophen (TYLENOL) 500 MG tablet Take 500 mg by mouth every 6 (six) hours as needed for mild pain.    Taking  . Ca Carbonate-Mag Hydroxide (ROLAIDS PO) Take 1-2 tablets by mouth as needed (heartburn).   Taking  . ferrous sulfate 325 (65 FE) MG tablet Take 1 tablet (325 mg total) by mouth 2 (two) times daily with a meal. (Patient not taking: Reported on 02/04/2015) 60 tablet 3 Not Taking  . flintstones complete (FLINTSTONES) 60 MG chewable tablet Chew 2 tablets by mouth daily.   Taking  . omeprazole (PRILOSEC) 20 MG capsule Take 20 mg by mouth daily as needed (for heartburn).    Taking    Review of Systems   Constitutional: Negative for fever and chills Eyes: Negative for visual disturbances Respiratory: Negative for shortness of breath, dyspnea Cardiovascular: Negative for chest pain or  palpitations  Gastrointestinal: Negative for vomiting, diarrhea and constipation Genitourinary: Negative for dysuria and urgency Musculoskeletal: Negative for back pain, joint pain, myalgias.  Normal ROM  Neurological: Negative for dizziness and headaches    Physical Exam  Blood pressure 111/64, pulse 104, temperature 98.2 F (36.8 C), temperature source Oral, resp. rate 18. GENERAL: Well-developed, well-nourished female in no acute distress.  LUNGS: Clear to auscultation bilaterally.  HEART: Regular rate and rhythm. ABDOMEN: Soft, nontender, nondistended, gravid.  EXTREMITIES: Nontender, no edema, 2+ distal pulses. DTR's 2+ CERVICAL EXAM: Dilatation 0cm   Effacement 0%   Station -3   Presentation: cephalic FHT:  Baseline rate 130 bpm   Variability moderate  Accelerations present   Decelerations none Contractions: mild and irregular, q 2-7 mins   Labs: No results found for this or any previous visit (from the past 24 hour(s)). Imaging Studies:  No results found.  Assessment: Kristina Robbins is  19 y.o. G3P0020 at [redacted]w[redacted]d presents with braxton hicks contractions.  Plan: D/c home with labor precautions  CRESENZO-DISHMAN,Cailee Blanke 6/17/201612:32 AM

## 2015-02-17 ENCOUNTER — Encounter (HOSPITAL_COMMUNITY): Payer: Self-pay

## 2015-02-17 ENCOUNTER — Inpatient Hospital Stay (HOSPITAL_COMMUNITY)
Admission: AD | Admit: 2015-02-17 | Discharge: 2015-02-17 | Disposition: A | Discharge status: Home / Self Care | Payer: Medicaid Other | Source: Ambulatory Visit | Attending: Obstetrics and Gynecology | Admitting: Obstetrics and Gynecology

## 2015-02-17 ENCOUNTER — Inpatient Hospital Stay (HOSPITAL_COMMUNITY): Payer: Medicaid Other

## 2015-02-17 DIAGNOSIS — Z3A36 36 weeks gestation of pregnancy: Secondary | ICD-10-CM | POA: Diagnosis not present

## 2015-02-17 DIAGNOSIS — O368130 Decreased fetal movements, third trimester, not applicable or unspecified: Secondary | ICD-10-CM | POA: Insufficient documentation

## 2015-02-17 DIAGNOSIS — O368190 Decreased fetal movements, unspecified trimester, not applicable or unspecified: Secondary | ICD-10-CM

## 2015-02-17 NOTE — MAU Provider Note (Signed)
History     CSN: [id number]  Arrival date and time: 02/17/15 7939   First Provider Initiated Contact with Patient 02/17/15 0138      Chief Complaint  Patient presents with  . Decreased Fetal Movement   HPI Patient is 19 y.o. [id number] [redacted]w[redacted]d here with complaints of decreased fetal movement today. Reports she has only felt two movements today. Once in the morning and once this evening after dinner. Had her normal activity today. Tried to sit and rest but didn't noticed increased movement. Denies LOF, VB, contractions, vaginal discharge. She has noticed two movements during evaluation in the MAU today.      OB History    Gravida Para Term Preterm AB TAB SAB Ectopic Multiple Living   3 0   2  2   0      Past Medical History  Diagnosis Date  . Irritable bowel syndrome   . Cystic fibroadenosis of breast     Past Surgical History  Procedure Laterality Date  . No past surgeries      Family History  Problem Relation Age of Onset  . Cystic fibrosis Mother   . Autism Brother     History  Substance Use Topics  . Smoking status: Current Every Day Smoker -- 0.50 packs/day  . Smokeless tobacco: Never Used  . Alcohol Use: No    Allergies: No Known Allergies  Prescriptions prior to admission  Medication Sig Dispense Refill Last Dose  . acetaminophen (TYLENOL) 500 MG tablet Take 500 mg by mouth every 6 (six) hours as needed for mild pain.    Past Week at Unknown time  . Ca Carbonate-Mag Hydroxide (ROLAIDS PO) Take 1-2 tablets by mouth as needed (heartburn).   Past Week at Unknown time  . flintstones complete (FLINTSTONES) 60 MG chewable tablet Chew 2 tablets by mouth daily.   Past Week at Unknown time  . omeprazole (PRILOSEC) 20 MG capsule Take 20 mg by mouth daily as needed (for heartburn).    Past Week at Unknown time  . ferrous sulfate 325 (65 FE) MG tablet Take 1 tablet (325 mg total) by mouth 2 (two) times daily with a meal. (Patient not taking: Reported on 02/04/2015) 60  tablet 3 Not Taking    Review of Systems  Constitutional: Negative for fever and chills.  Respiratory: Negative for shortness of breath.   Cardiovascular: Negative for chest pain and leg swelling.  Gastrointestinal: Negative for heartburn, nausea and vomiting.  Genitourinary: Negative for dysuria and urgency.       No contractions, vaginal bleeding, itching or discharge  Skin: Negative for itching and rash.  Neurological: Negative for dizziness and headaches.   Physical Exam   Blood pressure 120/66, pulse 99, temperature 98.2 F (36.8 C), temperature source Oral, resp. rate 16, height 5\' 3"  (1.6 m), weight 76.658 kg (169 lb).  Physical Exam  Constitutional: She is oriented to person, place, and time. She appears well-developed and well-nourished.  HENT:  Head: Normocephalic and atraumatic.  Eyes: Conjunctivae are normal.  Neck: Normal range of motion.  Cardiovascular:  No murmur heard. Respiratory: Effort normal. No respiratory distress.  GI: Soft. Bowel sounds are normal. She exhibits no distension. There is no tenderness.  Musculoskeletal: Normal range of motion. She exhibits no edema.  Neurological: She is alert and oriented to person, place, and time.  Skin: Skin is warm and dry.  Psychiatric: She has a normal mood and affect.   FHT: cat I, baselien 145, + accelerations (15x15),  no decelerations Toco: irregular contractions, patient not feeling them   MAU Course  Procedures  MDM 19 y.o.G3P0020 at [redacted]w[redacted]d presenting with complaint of decreased fetal movement today. FHT is Cat I, reassuring. Will get BPP to further stratify.   Assessment and Plan  Patient is 19 y.o. [id number] [redacted]w[redacted]d reporting decreased fetal movement today.  FHT is very reassuring. BPP shows score of 8/8 with AFI of 14, also reassuring. - fetal kick counts reinforced - preterm labor precautions  Patient history, exam, assessment and plan discussed with Dr. Su Hilt.    Fabio Asa 02/17/2015, 1:42 AM

## 2015-02-17 NOTE — Discharge Instructions (Signed)
Fetal Movement Counts Patient Name: __________________________________________________ Patient Due Date: ____________________ Performing a fetal movement count is highly recommended in high-risk pregnancies, but it is good for every pregnant woman to do. Your health care provider may ask you to start counting fetal movements at 28 weeks of the pregnancy. Fetal movements often increase:  After eating a full meal.  After physical activity.  After eating or drinking something sweet or cold.  At rest. Pay attention to when you feel the baby is most active. This will help you notice a pattern of your baby's sleep and wake cycles and what factors contribute to an increase in fetal movement. It is important to perform a fetal movement count at the same time each day when your baby is normally most active.  HOW TO COUNT FETAL MOVEMENTS  Find a quiet and comfortable area to sit or lie down on your left side. Lying on your left side provides the best blood and oxygen circulation to your baby.  Write down the day and time on a sheet of paper or in a journal.  Start counting kicks, flutters, swishes, rolls, or jabs in a 2-hour period. You should feel at least 10 movements within 2 hours.  If you do not feel 10 movements in 2 hours, wait 2-3 hours and count again. Look for a change in the pattern or not enough counts in 2 hours. SEEK MEDICAL CARE IF:  You feel less than 10 counts in 2 hours, tried twice.  There is no movement in over an hour.  The pattern is changing or taking longer each day to reach 10 counts in 2 hours.  You feel the baby is not moving as he or she usually does. Date: ____________ Movements: ____________ Start time: ____________ Doreatha Martin time: ____________  Date: ____________ Movements: ____________ Start time: ____________ Doreatha Martin time: ____________ Date: ____________ Movements: ____________ Start time: ____________ Doreatha Martin time: ____________ Date: ____________ Movements:  ____________ Start time: ____________ Doreatha Martin time: ____________ Date: ____________ Movements: ____________ Start time: ____________ Doreatha Martin time: ____________ Date: ____________ Movements: ____________ Start time: ____________ Doreatha Martin time: ____________ Date: ____________ Movements: ____________ Start time: ____________ Doreatha Martin time: ____________ Date: ____________ Movements: ____________ Start time: ____________ Doreatha Martin time: ____________  Date: ____________ Movements: ____________ Start time: ____________ Doreatha Martin time: ____________ Date: ____________ Movements: ____________ Start time: ____________ Doreatha Martin time: ____________ Date: ____________ Movements: ____________ Start time: ____________ Doreatha Martin time: ____________ Date: ____________ Movements: ____________ Start time: ____________ Doreatha Martin time: ____________ Date: ____________ Movements: ____________ Start time: ____________ Doreatha Martin time: ____________ Date: ____________ Movements: ____________ Start time: ____________ Doreatha Martin time: ____________ Date: ____________ Movements: ____________ Start time: ____________ Doreatha Martin time: ____________  Date: ____________ Movements: ____________ Start time: ____________ Doreatha Martin time: ____________ Date: ____________ Movements: ____________ Start time: ____________ Doreatha Martin time: ____________ Date: ____________ Movements: ____________ Start time: ____________ Doreatha Martin time: ____________ Date: ____________ Movements: ____________ Start time: ____________ Doreatha Martin time: ____________ Date: ____________ Movements: ____________ Start time: ____________ Doreatha Martin time: ____________ Date: ____________ Movements: ____________ Start time: ____________ Doreatha Martin time: ____________ Date: ____________ Movements: ____________ Start time: ____________ Doreatha Martin time: ____________  Date: ____________ Movements: ____________ Start time: ____________ Doreatha Martin time: ____________ Date: ____________ Movements: ____________ Start time: ____________ Doreatha Martin  time: ____________ Date: ____________ Movements: ____________ Start time: ____________ Doreatha Martin time: ____________ Date: ____________ Movements: ____________ Start time: ____________ Doreatha Martin time: ____________ Date: ____________ Movements: ____________ Start time: ____________ Doreatha Martin time: ____________ Date: ____________ Movements: ____________ Start time: ____________ Doreatha Martin time: ____________ Date: ____________ Movements: ____________ Start time: ____________ Doreatha Martin time: ____________  Date: ____________ Movements: ____________ Start time: ____________ Doreatha Martin  time: ____________ Date: ____________ Movements: ____________ Start time: ____________ Doreatha Martin time: ____________ Date: ____________ Movements: ____________ Start time: ____________ Doreatha Martin time: ____________ Date: ____________ Movements: ____________ Start time: ____________ Doreatha Martin time: ____________ Date: ____________ Movements: ____________ Start time: ____________ Doreatha Martin time: ____________ Date: ____________ Movements: ____________ Start time: ____________ Doreatha Martin time: ____________ Date: ____________ Movements: ____________ Start time: ____________ Doreatha Martin time: ____________  Date: ____________ Movements: ____________ Start time: ____________ Doreatha Martin time: ____________ Date: ____________ Movements: ____________ Start time: ____________ Doreatha Martin time: ____________ Date: ____________ Movements: ____________ Start time: ____________ Doreatha Martin time: ____________ Date: ____________ Movements: ____________ Start time: ____________ Doreatha Martin time: ____________ Date: ____________ Movements: ____________ Start time: ____________ Doreatha Martin time: ____________ Date: ____________ Movements: ____________ Start time: ____________ Doreatha Martin time: ____________ Date: ____________ Movements: ____________ Start time: ____________ Doreatha Martin time: ____________  Date: ____________ Movements: ____________ Start time: ____________ Doreatha Martin time: ____________ Date: ____________  Movements: ____________ Start time: ____________ Doreatha Martin time: ____________ Date: ____________ Movements: ____________ Start time: ____________ Doreatha Martin time: ____________ Date: ____________ Movements: ____________ Start time: ____________ Doreatha Martin time: ____________ Date: ____________ Movements: ____________ Start time: ____________ Doreatha Martin time: ____________ Date: ____________ Movements: ____________ Start time: ____________ Doreatha Martin time: ____________ Date: ____________ Movements: ____________ Start time: ____________ Doreatha Martin time: ____________  Date: ____________ Movements: ____________ Start time: ____________ Doreatha Martin time: ____________ Date: ____________ Movements: ____________ Start time: ____________ Doreatha Martin time: ____________ Date: ____________ Movements: ____________ Start time: ____________ Doreatha Martin time: ____________ Date: ____________ Movements: ____________ Start time: ____________ Doreatha Martin time: ____________ Date: ____________ Movements: ____________ Start time: ____________ Doreatha Martin time: ____________ Date: ____________ Movements: ____________ Start time: ____________ Doreatha Martin time: ____________ Document Released: 09/14/2006 Document Revised: 12/30/2013 Document Reviewed: 06/11/2012 [employer] Patient Information 2015 [employer]. This information is not intended to replace advice given to you by your health care provider. Make sure you discuss any questions you have with your health care provider. Preterm Labor Information Preterm labor is when labor starts at less than 37 weeks of pregnancy. The normal length of a pregnancy is 39 to 41 weeks. CAUSES Often, there is no identifiable underlying cause as to why a woman goes into preterm labor. One of the most common known causes of preterm labor is infection. Infections of the uterus, cervix, vagina, amniotic sac, bladder, kidney, or even the lungs (pneumonia) can cause labor to start. Other suspected causes of preterm labor include:   Urogenital  infections, such as yeast infections and bacterial vaginosis.   Uterine abnormalities (uterine shape, uterine septum, fibroids, or bleeding from the placenta).   A cervix that has been operated on (it may fail to stay closed).   Malformations in the fetus.   Multiple gestations (twins, triplets, and so on).   Breakage of the amniotic sac.  RISK FACTORS  Having a previous history of preterm labor.   Having premature rupture of membranes (PROM).   Having a placenta that covers the opening of the cervix (placenta previa).   Having a placenta that separates from the uterus (placental abruption).   Having a cervix that is too weak to hold the fetus in the uterus (incompetent cervix).   Having too much fluid in the amniotic sac (polyhydramnios).   Taking illegal drugs or smoking while pregnant.   Not gaining enough weight while pregnant.   Being younger than 81 and older than 21 years old.   Having a low socioeconomic status.   Being African American. SYMPTOMS Signs and symptoms of preterm labor include:   Menstrual-like cramps, abdominal pain, or back pain.  Uterine contractions that are regular, as frequent as six in an  hour, regardless of their intensity (may be mild or painful).  Contractions that start on the top of the uterus and spread down to the lower abdomen and back.   A sense of increased pelvic pressure.   A watery or bloody mucus discharge that comes from the vagina.  TREATMENT Depending on the length of the pregnancy and other circumstances, your health care provider may suggest bed rest. If necessary, there are medicines that can be given to stop contractions and to mature the fetal lungs. If labor happens before 34 weeks of pregnancy, a prolonged hospital stay may be recommended. Treatment depends on the condition of both you and the fetus.  WHAT SHOULD YOU DO IF YOU THINK YOU ARE IN PRETERM LABOR? Call your health care provider right  away. You will need to go to the hospital to get checked immediately. HOW CAN YOU PREVENT PRETERM LABOR IN FUTURE PREGNANCIES? You should:   Stop smoking if you smoke.  Maintain healthy weight gain and avoid chemicals and drugs that are not necessary.  Be watchful for any type of infection.  Inform your health care provider if you have a known history of preterm labor. Document Released: 11/05/2003 Document Revised: 04/17/2013 Document Reviewed: 09/17/2012 [facility] Patient Information 2015 [place], [place]. This information is not intended to replace advice given to you by your health care provider. Make sure you discuss any questions you have with your health care provider.

## 2015-02-17 NOTE — MAU Note (Signed)
Pt has not felt baby move since 1830. Denies contractions, vag bleeding or LOF.

## 2015-02-19 ENCOUNTER — Ambulatory Visit (INDEPENDENT_AMBULATORY_CARE_PROVIDER_SITE_OTHER): Payer: BC Managed Care – PPO | Admitting: Advanced Practice Midwife

## 2015-02-19 VITALS — BP 120/70 | HR 102 | Wt 171.5 lb

## 2015-02-19 DIAGNOSIS — Z369 Encounter for antenatal screening, unspecified: Secondary | ICD-10-CM

## 2015-02-19 DIAGNOSIS — Z3493 Encounter for supervision of normal pregnancy, unspecified, third trimester: Secondary | ICD-10-CM

## 2015-02-19 DIAGNOSIS — Z331 Pregnant state, incidental: Secondary | ICD-10-CM

## 2015-02-19 DIAGNOSIS — Z1389 Encounter for screening for other disorder: Secondary | ICD-10-CM

## 2015-02-19 LAB — POCT URINALYSIS DIPSTICK
Blood, UA: NEGATIVE
Glucose, UA: NEGATIVE
KETONES UA: NEGATIVE
Leukocytes, UA: NEGATIVE
Nitrite, UA: NEGATIVE
Protein, UA: NEGATIVE

## 2015-02-19 NOTE — Progress Notes (Signed)
Pt states that her lower back is killing her, bad pressure and it feels like she has to go to the bathroom all the time but can't go. Pt states that this morning she had a stabbing pain on her left lower stomach when getting out of bed this morning.

## 2015-02-19 NOTE — Progress Notes (Signed)
[id number] [redacted]w[redacted]d Estimated Date of Delivery: 03/14/15  Blood pressure 120/70, pulse 102, weight 171 lb 8 oz (77.792 kg).   BP weight and urine results all reviewed and noted.  Please refer to the obstetrical flow sheet for the fundal height and fetal heart rate documentation:  Patient reports good fetal movement, denies any bleeding and no rupture of membranes symptoms or regular contractions. Patient is without complaints. All questions were answered.  Plan:  Continued routine obstetrical care, GBS today  Follow up in 1 weeks for OB appointment,

## 2015-02-20 ENCOUNTER — Encounter (HOSPITAL_COMMUNITY): Payer: Self-pay

## 2015-02-20 ENCOUNTER — Inpatient Hospital Stay (HOSPITAL_COMMUNITY)
Admission: AD | Admit: 2015-02-20 | Discharge: 2015-02-20 | Disposition: A | Discharge status: Home / Self Care | Payer: Medicaid Other | Source: Ambulatory Visit | Attending: Obstetrics & Gynecology | Admitting: Obstetrics & Gynecology

## 2015-02-20 DIAGNOSIS — O9989 Other specified diseases and conditions complicating pregnancy, childbirth and the puerperium: Secondary | ICD-10-CM | POA: Diagnosis not present

## 2015-02-20 DIAGNOSIS — O26893 Other specified pregnancy related conditions, third trimester: Secondary | ICD-10-CM

## 2015-02-20 DIAGNOSIS — Z3A36 36 weeks gestation of pregnancy: Secondary | ICD-10-CM | POA: Insufficient documentation

## 2015-02-20 DIAGNOSIS — R109 Unspecified abdominal pain: Secondary | ICD-10-CM | POA: Diagnosis present

## 2015-02-20 DIAGNOSIS — O99333 Smoking (tobacco) complicating pregnancy, third trimester: Secondary | ICD-10-CM | POA: Insufficient documentation

## 2015-02-20 DIAGNOSIS — N898 Other specified noninflammatory disorders of vagina: Secondary | ICD-10-CM | POA: Insufficient documentation

## 2015-02-20 LAB — AMNISURE RUPTURE OF MEMBRANE (ROM) NOT AT ARMC: Amnisure ROM: NEGATIVE

## 2015-02-20 LAB — GC/CHLAMYDIA PROBE AMP
Chlamydia trachomatis, NAA: NEGATIVE
Neisseria gonorrhoeae by PCR: NEGATIVE

## 2015-02-20 NOTE — MAU Provider Note (Signed)
  History     CSN: [id number]  Arrival date and time: 02/20/15 2023   First Provider Initiated Contact with Patient 02/20/15 2133      Chief Complaint  Patient presents with  . Rupture of Membranes  . Abdominal Cramping   HPI Kristina Robbins is a 19yo G3P0020 @ 36.6wks by LMP & 12wk scan who presents for eval of leaking fluid during intercourse this evening. Reports seeing fluid pool on the bed that she denies could be semen. No bldg; is having mild cramping ever since IC. Her preg has been followed by the [facility] service and has been essentially unremarkable other than 1) lt EICF.  OB History    Gravida Para Term Preterm AB TAB SAB Ectopic Multiple Living   3 0   2  2   0      Past Medical History  Diagnosis Date  . Irritable bowel syndrome   . Cystic fibroadenosis of breast     Past Surgical History  Procedure Laterality Date  . No past surgeries      Family History  Problem Relation Age of Onset  . Cystic fibrosis Mother   . Autism Brother     History  Substance Use Topics  . Smoking status: Current Every Day Smoker -- 0.50 packs/day  . Smokeless tobacco: Never Used  . Alcohol Use: No    Allergies: No Known Allergies  Prescriptions prior to admission  Medication Sig Dispense Refill Last Dose  . acetaminophen (TYLENOL) 500 MG tablet Take 500 mg by mouth every 6 (six) hours as needed for mild pain.    02/20/2015 at Unknown time  . Ca Carbonate-Mag Hydroxide (ROLAIDS PO) Take 1-2 tablets by mouth as needed (heartburn).   02/20/2015 at Unknown time  . flintstones complete (FLINTSTONES) 60 MG chewable tablet Chew 2 tablets by mouth daily.   02/19/2015 at Unknown time  . omeprazole (PRILOSEC) 20 MG capsule Take 20 mg by mouth daily as needed (for heartburn).    two weeks  . ferrous sulfate 325 (65 FE) MG tablet Take 1 tablet (325 mg total) by mouth 2 (two) times daily with a meal. (Patient not taking: Reported on 02/04/2015) 60 tablet 3 Not Taking    ROS- no other  pertinent s/s other than what is in HPI  Physical Exam   Blood pressure 122/70, pulse 104, temperature 98.1 F (36.7 C), resp. rate 18.  Physical Exam  Constitutional: She is oriented to person, place, and time. She appears well-developed.  HENT:  Head: Normocephalic.  Neck: Normal range of motion.  Cardiovascular: Normal rate.   Respiratory: Effort normal.  GI:  EFM 140s, +accels, no decels, occ mi variables Rare ctx/irritability  Genitourinary: Vagina normal.  SSE: neg pool, neg fern Cx: post/C/L  Musculoskeletal: Normal range of motion.  Neurological: She is alert and oriented to person, place, and time.  Skin: Skin is warm and dry.  Psychiatric: She has a normal mood and affect. Her behavior is normal. Thought content normal.   Fern: neg Amnisure: neg  MAU Course  Procedures  MDM NST read SSE done Fern test read Amnisure ordered  Assessment and Plan  IUP@36 .6wks Vag discharge  D/C home w/ labor/ROM/bldg precautions F/U as scheduled at next [facility] visit  Cam Hai CNM 02/20/2015, 9:44 PM

## 2015-02-20 NOTE — Discharge Instructions (Signed)
Braxton Hicks Contractions °Contractions of the uterus can occur throughout pregnancy. Contractions are not always a sign that you are in labor.  °WHAT ARE BRAXTON HICKS CONTRACTIONS?  °Contractions that occur before labor are called Braxton Hicks contractions, or false labor. Toward the end of pregnancy (32-34 weeks), these contractions can develop more often and may become more forceful. This is not true labor because these contractions do not result in opening (dilatation) and thinning of the cervix. They are sometimes difficult to tell apart from true labor because these contractions can be forceful and people have different pain tolerances. You should not feel embarrassed if you go to the hospital with false labor. Sometimes, the only way to tell if you are in true labor is for your health care provider to look for changes in the cervix. °If there are no prenatal problems or other health problems associated with the pregnancy, it is completely safe to be sent home with false labor and await the onset of true labor. °HOW CAN YOU TELL THE DIFFERENCE BETWEEN TRUE AND FALSE LABOR? °False Labor °· The contractions of false labor are usually shorter and not as hard as those of true labor.   °· The contractions are usually irregular.   °· The contractions are often felt in the front of the lower abdomen and in the groin.   °· The contractions may go away when you walk around or change positions while lying down.   °· The contractions get weaker and are shorter lasting as time goes on.   °· The contractions do not usually become progressively stronger, regular, and closer together as with true labor.   °True Labor °· Contractions in true labor last 30-70 seconds, become very regular, usually become more intense, and increase in frequency.   °· The contractions do not go away with walking.   °· The discomfort is usually felt in the top of the uterus and spreads to the lower abdomen and low back.   °· True labor can be  determined by your health care provider with an exam. This will show that the cervix is dilating and getting thinner.   °WHAT TO REMEMBER °· Keep up with your usual exercises and follow other instructions given by your health care provider.   °· Take medicines as directed by your health care provider.   °· Keep your regular prenatal appointments.   °· Eat and drink lightly if you think you are going into labor.   °· If Braxton Hicks contractions are making you uncomfortable:   °¨ Change your position from lying down or resting to walking, or from walking to resting.   °¨ Sit and rest in a tub of warm water.   °¨ Drink 2-3 glasses of water. Dehydration may cause these contractions.   °¨ Do slow and deep breathing several times an hour.   °WHEN SHOULD I SEEK IMMEDIATE MEDICAL CARE? °Seek immediate medical care if: °· Your contractions become stronger, more regular, and closer together.   °· You have fluid leaking or gushing from your vagina.   °· You have a fever.   °· You pass blood-tinged mucus.   °· You have vaginal bleeding.   °· You have continuous abdominal pain.   °· You have low back pain that you never had before.   °· You feel your baby's head pushing down and causing pelvic pressure.   °· Your baby is not moving as much as it used to.   °Document Released: 08/15/2005 Document Revised: 08/20/2013 Document Reviewed: 05/27/2013 °ExitCare® Patient Information ©2015 [employer]. This information is not intended to replace advice given to you by your health care   provider. Make sure you discuss any questions you have with your health care provider. ° °

## 2015-02-20 NOTE — MAU Note (Signed)
Abdominal cramping, gush of fluid while having intercourse tonight.

## 2015-02-21 LAB — STREP GP B NAA+RFLX: Strep Gp B NAA+Rflx: NEGATIVE

## 2015-02-24 ENCOUNTER — Ambulatory Visit (INDEPENDENT_AMBULATORY_CARE_PROVIDER_SITE_OTHER): Payer: BC Managed Care – PPO | Admitting: Women's Health

## 2015-02-24 VITALS — BP 118/70 | HR 128 | Wt 171.0 lb

## 2015-02-24 DIAGNOSIS — Z331 Pregnant state, incidental: Secondary | ICD-10-CM

## 2015-02-24 DIAGNOSIS — O368131 Decreased fetal movements, third trimester, fetus 1: Secondary | ICD-10-CM

## 2015-02-24 DIAGNOSIS — Z3493 Encounter for supervision of normal pregnancy, unspecified, third trimester: Secondary | ICD-10-CM

## 2015-02-24 DIAGNOSIS — Z1389 Encounter for screening for other disorder: Secondary | ICD-10-CM

## 2015-02-24 NOTE — Progress Notes (Signed)
Pt states that she has had no sleep in four days. Pt states that she has had contractions since last night, her lower abdomin, lower back, and a lot of pressure like she needs to go to the bathroom. Pt states that she has had nausea every morning the last three mornings and that she has nausea after every time she eats.

## 2015-02-24 NOTE — Progress Notes (Signed)
Work-in Low-risk OB appointment [id number] Estimated Date of Delivery: 03/14/15 BP 118/70 mmHg  Pulse 128  Wt 171 lb (77.565 kg)  BP, weight, and urine reviewed.  Refer to obstetrical flow sheet for FH & FHR.  Reports no fm since 0500, now 1215.  [facility] to only be concerned if no fm by 5pm. Denies regular uc's, lof, vb, or uti s/s. Contractions x 4 days, not sleeping, miserable. NST reactive, 1uc, pt felt fm while on efm SVE: 1/th/-2, post, vtx Reviewed labor s/s, fkc. Sleep tips given.  Plan:  Continue routine obstetrical care  Can cancel Fri appt and F/U in 1wk for OB appointment

## 2015-02-24 NOTE — Patient Instructions (Signed)
Call the office [phone]) or go to [facility] if:  You begin to have strong, frequent contractions  Your water breaks.  Sometimes it is a big gush of fluid, sometimes it is just a trickle that keeps getting your panties wet or running down your legs  You have vaginal bleeding.  It is normal to have a small amount of spotting if your cervix was checked.   You don't feel your baby moving like normal.  If you don't, get you something to eat and drink and lay down and focus on feeling your baby move.  You should feel at least 10 movements in 2 hours.  If you don't, you should call the office or go to [facility].    Tips to Help You Sleep Better:   Get into a bedtime routine, try to do the same thing every night before going to bed to try to help your body wind down  Warm baths  Avoid caffeine for at least 3 hours before going to sleep   Keep your room at a slightly cooler temperature, can try running a fan  Turn off TV, lights, phone, electronics  Lots of pillows if needed to help you get comfortable  Lavender scented items can help you sleep. You can place lavender essential oil on a cotton ball and place under your pillowcase, or place in a diffuser. [employer] has a lavender scented sleep line (plug-ins, sprays, etc). Look in the pillow aisle for lavender scented pillows.   If none of the above things help, you can try 1/2 of a benadryl, unisom, or tylenol pm. Do not take this every night, only when you really need it.    Fetal Movement Counts Patient Name: __________________________________________________ Patient Due Date: ____________________ Performing a fetal movement count is highly recommended in high-risk pregnancies, but it is good for every pregnant woman to do. Your health care provider may ask you to start counting fetal movements at 28 weeks of the pregnancy. Fetal movements often increase:  After eating a full meal.  After physical activity.  After eating  or drinking something sweet or cold.  At rest. Pay attention to when you feel the baby is most active. This will help you notice a pattern of your baby's sleep and wake cycles and what factors contribute to an increase in fetal movement. It is important to perform a fetal movement count at the same time each day when your baby is normally most active.  HOW TO COUNT FETAL MOVEMENTS  Find a quiet and comfortable area to sit or lie down on your left side. Lying on your left side provides the best blood and oxygen circulation to your baby.  Write down the day and time on a sheet of paper or in a journal.  Start counting kicks, flutters, swishes, rolls, or jabs in a 2-hour period. You should feel at least 10 movements within 2 hours.  If you do not feel 10 movements in 2 hours, wait 2-3 hours and count again. Look for a change in the pattern or not enough counts in 2 hours. SEEK MEDICAL CARE IF:  You feel less than 10 counts in 2 hours, tried twice.  There is no movement in over an hour.  The pattern is changing or taking longer each day to reach 10 counts in 2 hours.  You feel the baby is not moving as he or she usually does. Date: ____________ Movements: ____________ Start time: ____________ Doreatha Martin time: ____________  Date: ____________  Movements: ____________ Start time: ____________ Doreatha MartinFinish time: ____________ Date: ____________ Movements: ____________ Start time: ____________ Doreatha MartinFinish time: ____________ Date: ____________ Movements: ____________ Start time: ____________ Doreatha MartinFinish time: ____________ Date: ____________ Movements: ____________ Start time: ____________ Doreatha MartinFinish time: ____________ Date: ____________ Movements: ____________ Start time: ____________ Doreatha MartinFinish time: ____________ Date: ____________ Movements: ____________ Start time: ____________ Doreatha MartinFinish time: ____________ Date: ____________ Movements: ____________ Start time: ____________ Doreatha MartinFinish time: ____________  Date: ____________  Movements: ____________ Start time: ____________ Doreatha MartinFinish time: ____________ Date: ____________ Movements: ____________ Start time: ____________ Doreatha MartinFinish time: ____________ Date: ____________ Movements: ____________ Start time: ____________ Doreatha MartinFinish time: ____________ Date: ____________ Movements: ____________ Start time: ____________ Doreatha MartinFinish time: ____________ Date: ____________ Movements: ____________ Start time: ____________ Doreatha MartinFinish time: ____________ Date: ____________ Movements: ____________ Start time: ____________ Doreatha MartinFinish time: ____________ Date: ____________ Movements: ____________ Start time: ____________ Doreatha MartinFinish time: ____________  Date: ____________ Movements: ____________ Start time: ____________ Doreatha MartinFinish time: ____________ Date: ____________ Movements: ____________ Start time: ____________ Doreatha MartinFinish time: ____________ Date: ____________ Movements: ____________ Start time: ____________ Doreatha MartinFinish time: ____________ Date: ____________ Movements: ____________ Start time: ____________ Doreatha MartinFinish time: ____________ Date: ____________ Movements: ____________ Start time: ____________ Doreatha MartinFinish time: ____________ Date: ____________ Movements: ____________ Start time: ____________ Doreatha MartinFinish time: ____________ Date: ____________ Movements: ____________ Start time: ____________ Doreatha MartinFinish time: ____________  Date: ____________ Movements: ____________ Start time: ____________ Doreatha MartinFinish time: ____________ Date: ____________ Movements: ____________ Start time: ____________ Doreatha MartinFinish time: ____________ Date: ____________ Movements: ____________ Start time: ____________ Doreatha MartinFinish time: ____________ Date: ____________ Movements: ____________ Start time: ____________ Doreatha MartinFinish time: ____________ Date: ____________ Movements: ____________ Start time: ____________ Doreatha MartinFinish time: ____________ Date: ____________ Movements: ____________ Start time: ____________ Doreatha MartinFinish time: ____________ Date: ____________ Movements: ____________ Start time:  ____________ Doreatha MartinFinish time: ____________  Date: ____________ Movements: ____________ Start time: ____________ Doreatha MartinFinish time: ____________ Date: ____________ Movements: ____________ Start time: ____________ Doreatha MartinFinish time: ____________ Date: ____________ Movements: ____________ Start time: ____________ Doreatha MartinFinish time: ____________ Date: ____________ Movements: ____________ Start time: ____________ Doreatha MartinFinish time: ____________ Date: ____________ Movements: ____________ Start time: ____________ Doreatha MartinFinish time: ____________ Date: ____________ Movements: ____________ Start time: ____________ Doreatha MartinFinish time: ____________ Date: ____________ Movements: ____________ Start time: ____________ Doreatha MartinFinish time: ____________  Date: ____________ Movements: ____________ Start time: ____________ Doreatha MartinFinish time: ____________ Date: ____________ Movements: ____________ Start time: ____________ Doreatha MartinFinish time: ____________ Date: ____________ Movements: ____________ Start time: ____________ Doreatha MartinFinish time: ____________ Date: ____________ Movements: ____________ Start time: ____________ Doreatha MartinFinish time: ____________ Date: ____________ Movements: ____________ Start time: ____________ Doreatha MartinFinish time: ____________ Date: ____________ Movements: ____________ Start time: ____________ Doreatha MartinFinish time: ____________ Date: ____________ Movements: ____________ Start time: ____________ Doreatha MartinFinish time: ____________  Date: ____________ Movements: ____________ Start time: ____________ Doreatha MartinFinish time: ____________ Date: ____________ Movements: ____________ Start time: ____________ Doreatha MartinFinish time: ____________ Date: ____________ Movements: ____________ Start time: ____________ Doreatha MartinFinish time: ____________ Date: ____________ Movements: ____________ Start time: ____________ Doreatha MartinFinish time: ____________ Date: ____________ Movements: ____________ Start time: ____________ Doreatha MartinFinish time: ____________ Date: ____________ Movements: ____________ Start time: ____________ Doreatha MartinFinish time: ____________ Date:  ____________ Movements: ____________ Start time: ____________ Doreatha MartinFinish time: ____________  Date: ____________ Movements: ____________ Start time: ____________ Doreatha MartinFinish time: ____________ Date: ____________ Movements: ____________ Start time: ____________ Doreatha MartinFinish time: ____________ Date: ____________ Movements: ____________ Start time: ____________ Doreatha MartinFinish time: ____________ Date: ____________ Movements: ____________ Start time: ____________ Doreatha MartinFinish time: ____________ Date: ____________ Movements: ____________ Start time: ____________ Doreatha MartinFinish time: ____________ Date: ____________ Movements: ____________ Start time: ____________ Doreatha MartinFinish time: ____________ Document Released: 09/14/2006 Document Revised: 12/30/2013 Document Reviewed: 06/11/2012 [employer] Patient Information 2015 [employer]. This information is not intended to replace advice given to you by your health care provider. Make sure you discuss any questions you have with your health  care provider.  Braxton Hicks Contractions Contractions of the uterus can occur throughout pregnancy. Contractions are not always a sign that you are in labor.  WHAT ARE BRAXTON HICKS CONTRACTIONS?  Contractions that occur before labor are called Braxton Hicks contractions, or false labor. Toward the end of pregnancy (32-34 weeks), these contractions can develop more often and may become more forceful. This is not true labor because these contractions do not result in opening (dilatation) and thinning of the cervix. They are sometimes difficult to tell apart from true labor because these contractions can be forceful and people have different pain tolerances. You should not feel embarrassed if you go to the hospital with false labor. Sometimes, the only way to tell if you are in true labor is for your health care provider to look for changes in the cervix. If there are no prenatal problems or other health problems associated with the pregnancy, it is completely safe to be  sent home with false labor and await the onset of true labor. HOW CAN YOU TELL THE DIFFERENCE BETWEEN TRUE AND FALSE LABOR? False Labor  The contractions of false labor are usually shorter and not as hard as those of true labor.   The contractions are usually irregular.   The contractions are often felt in the front of the lower abdomen and in the groin.   The contractions may go away when you walk around or change positions while lying down.   The contractions get weaker and are shorter lasting as time goes on.   The contractions do not usually become progressively stronger, regular, and closer together as with true labor.  True Labor  Contractions in true labor last 30-70 seconds, become very regular, usually become more intense, and increase in frequency.   The contractions do not go away with walking.   The discomfort is usually felt in the top of the uterus and spreads to the lower abdomen and low back.   True labor can be determined by your health care provider with an exam. This will show that the cervix is dilating and getting thinner.  WHAT TO REMEMBER  Keep up with your usual exercises and follow other instructions given by your health care provider.   Take medicines as directed by your health care provider.   Keep your regular prenatal appointments.   Eat and drink lightly if you think you are going into labor.   If Braxton Hicks contractions are making you uncomfortable:   Change your position from lying down or resting to walking, or from walking to resting.   Sit and rest in a tub of warm water.   Drink 2-3 glasses of water. Dehydration may cause these contractions.   Do slow and deep breathing several times an hour.  WHEN SHOULD I SEEK IMMEDIATE MEDICAL CARE? Seek immediate medical care if:  Your contractions become stronger, more regular, and closer together.   You have fluid leaking or gushing from your vagina.   You have a  fever.   You pass blood-tinged mucus.   You have vaginal bleeding.   You have continuous abdominal pain.   You have low back pain that you never had before.   You feel your baby's head pushing down and causing pelvic pressure.   Your baby is not moving as much as it used to.  Document Released: 08/15/2005 Document Revised: 08/20/2013 Document Reviewed: 05/27/2013 [facility] Patient Information 2015 [place], [place]. This information is not intended to replace advice given to you  by your health care provider. Make sure you discuss any questions you have with your health care provider.

## 2015-02-27 ENCOUNTER — Encounter: Payer: BC Managed Care – PPO | Admitting: Obstetrics & Gynecology

## 2015-03-02 ENCOUNTER — Encounter (HOSPITAL_COMMUNITY): Payer: Self-pay | Admitting: *Deleted

## 2015-03-02 ENCOUNTER — Inpatient Hospital Stay (HOSPITAL_COMMUNITY)
Admission: AD | Admit: 2015-03-02 | Discharge: 2015-03-02 | Disposition: A | Discharge status: Home / Self Care | Payer: Medicaid Other | Source: Ambulatory Visit | Attending: Obstetrics & Gynecology | Admitting: Obstetrics & Gynecology

## 2015-03-02 DIAGNOSIS — O471 False labor at or after 37 completed weeks of gestation: Secondary | ICD-10-CM | POA: Diagnosis not present

## 2015-03-02 DIAGNOSIS — Z3A38 38 weeks gestation of pregnancy: Secondary | ICD-10-CM | POA: Diagnosis not present

## 2015-03-02 DIAGNOSIS — Z3493 Encounter for supervision of normal pregnancy, unspecified, third trimester: Secondary | ICD-10-CM | POA: Insufficient documentation

## 2015-03-02 LAB — URINALYSIS, ROUTINE W REFLEX MICROSCOPIC
Bilirubin Urine: NEGATIVE
Glucose, UA: NEGATIVE mg/dL
KETONES UR: NEGATIVE mg/dL
Leukocytes, UA: NEGATIVE
Nitrite: NEGATIVE
PROTEIN: NEGATIVE mg/dL
Specific Gravity, Urine: 1.02 (ref 1.005–1.030)
Urobilinogen, UA: 0.2 mg/dL (ref 0.0–1.0)
pH: 6 (ref 5.0–8.0)

## 2015-03-02 LAB — URINE MICROSCOPIC-ADD ON

## 2015-03-02 NOTE — [facility] Note (Signed)
Pt. Here for contractions and bleeding. Pt. States she started to contract last night before bed and was not timing them. This am woke up around 0430 and had painful contractions at that time. Denies any bleeding or discharge at that time. Pt. States around 11:30am she had brown discharge. Before coming here had pink spotting. Does not feel that it was mucous and has passed her mucous plug awhile ago. Last OB appointment was Tuesday and was 1 and 25 % effaced. Will be seen this Friday. Pt. Felt that her vagina was swollen but wasn't experiencing discharge at that time and felt pressure only. Baby is moving well. Last intercourse was 2am.

## 2015-03-02 NOTE — MAU Note (Signed)
Patient presents at 5438 weeks gestation with c/o abdominal pain and vaginal bleeding. Fetus active. Denies discharge.

## 2015-03-02 NOTE — Discharge Instructions (Signed)
Braxton Hicks Contractions °Contractions of the uterus can occur throughout pregnancy. Contractions are not always a sign that you are in labor.  °WHAT ARE BRAXTON HICKS CONTRACTIONS?  °Contractions that occur before labor are called Braxton Hicks contractions, or false labor. Toward the end of pregnancy (32-34 weeks), these contractions can develop more often and may become more forceful. This is not true labor because these contractions do not result in opening (dilatation) and thinning of the cervix. They are sometimes difficult to tell apart from true labor because these contractions can be forceful and people have different pain tolerances. You should not feel embarrassed if you go to the hospital with false labor. Sometimes, the only way to tell if you are in true labor is for your health care provider to look for changes in the cervix. °If there are no prenatal problems or other health problems associated with the pregnancy, it is completely safe to be sent home with false labor and await the onset of true labor. °HOW CAN YOU TELL THE DIFFERENCE BETWEEN TRUE AND FALSE LABOR? °False Labor °· The contractions of false labor are usually shorter and not as hard as those of true labor.   °· The contractions are usually irregular.   °· The contractions are often felt in the front of the lower abdomen and in the groin.   °· The contractions may go away when you walk around or change positions while lying down.   °· The contractions get weaker and are shorter lasting as time goes on.   °· The contractions do not usually become progressively stronger, regular, and closer together as with true labor.   °True Labor °· Contractions in true labor last 30-70 seconds, become very regular, usually become more intense, and increase in frequency.   °· The contractions do not go away with walking.   °· The discomfort is usually felt in the top of the uterus and spreads to the lower abdomen and low back.   °· True labor can be  determined by your health care provider with an exam. This will show that the cervix is dilating and getting thinner.   °WHAT TO REMEMBER °· Keep up with your usual exercises and follow other instructions given by your health care provider.   °· Take medicines as directed by your health care provider.   °· Keep your regular prenatal appointments.   °· Eat and drink lightly if you think you are going into labor.   °· If Braxton Hicks contractions are making you uncomfortable:   °¨ Change your position from lying down or resting to walking, or from walking to resting.   °¨ Sit and rest in a tub of warm water.   °¨ Drink 2-3 glasses of water. Dehydration may cause these contractions.   °¨ Do slow and deep breathing several times an hour.   °WHEN SHOULD I SEEK IMMEDIATE MEDICAL CARE? °Seek immediate medical care if: °· Your contractions become stronger, more regular, and closer together.   °· You have fluid leaking or gushing from your vagina.   °· You have a fever.   °· You pass blood-tinged mucus.   °· You have vaginal bleeding.   °· You have continuous abdominal pain.   °· You have low back pain that you never had before.   °· You feel your baby's head pushing down and causing pelvic pressure.   °· Your baby is not moving as much as it used to.   °Document Released: 08/15/2005 Document Revised: 08/20/2013 Document Reviewed: 05/27/2013 °ExitCare® Patient Information ©2015 [employer]. This information is not intended to replace advice given to you by your health care   provider. Make sure you discuss any questions you have with your health care provider. ° °

## 2015-03-04 ENCOUNTER — Telehealth: Payer: Self-pay | Admitting: *Deleted

## 2015-03-04 NOTE — Telephone Encounter (Signed)
Spoke with pt. Pt was at the hospital Monday. She had bloody show and contractions. Bloody show stopped but returned today. Contractions off and on, nothing regular. + baby movement. I advised everything sounds ok at this point. Advised to take it easy and drink plenty of fluids. Advised if contractions got more regular or decreased fetal movement, call us or go to [facility]. Pt voiced understanding. Pt has scheduled appt Friday. JSY

## 2015-03-04 NOTE — Telephone Encounter (Signed)
Pt c/o period like cramping, brownish vaginal spotting this am but appears to be more reddish color with small clots now, pt states she is wearing panty liners, +FM. [facility] and sent home, [facility]. Pt advised to push fluids, take tylenol for cramping, continue to monitor vaginal spotting if no improvement or worsens, decrease FM pt to go to [facility]. Did offer this pt an appt for tomorrow but pt states she needed to take her Mother to an appt and would not be able to come.

## 2015-03-05 ENCOUNTER — Encounter: Payer: BC Managed Care – PPO | Admitting: Advanced Practice Midwife

## 2015-03-05 ENCOUNTER — Inpatient Hospital Stay (HOSPITAL_COMMUNITY)
Admission: AD | Admit: 2015-03-05 | Discharge: 2015-03-08 | DRG: 775 | Disposition: A | Discharge status: Home / Self Care | Payer: Medicaid Other | Source: Ambulatory Visit | Attending: Family Medicine | Admitting: Family Medicine

## 2015-03-05 ENCOUNTER — Encounter (HOSPITAL_COMMUNITY): Payer: Self-pay

## 2015-03-05 ENCOUNTER — Ambulatory Visit (INDEPENDENT_AMBULATORY_CARE_PROVIDER_SITE_OTHER): Payer: BC Managed Care – PPO | Admitting: Obstetrics and Gynecology

## 2015-03-05 ENCOUNTER — Inpatient Hospital Stay (HOSPITAL_COMMUNITY)
Admission: AD | Admit: 2015-03-05 | Discharge: 2015-03-05 | Disposition: A | Discharge status: Home / Self Care | Payer: Medicaid Other | Source: Ambulatory Visit | Attending: Obstetrics & Gynecology | Admitting: Obstetrics & Gynecology

## 2015-03-05 ENCOUNTER — Encounter: Payer: Self-pay | Admitting: Obstetrics and Gynecology

## 2015-03-05 VITALS — BP 120/80 | HR 80 | Wt 171.0 lb

## 2015-03-05 DIAGNOSIS — Z3A38 38 weeks gestation of pregnancy: Secondary | ICD-10-CM | POA: Diagnosis present

## 2015-03-05 DIAGNOSIS — Z1389 Encounter for screening for other disorder: Secondary | ICD-10-CM

## 2015-03-05 DIAGNOSIS — O99334 Smoking (tobacco) complicating childbirth: Secondary | ICD-10-CM | POA: Diagnosis present

## 2015-03-05 DIAGNOSIS — Z3493 Encounter for supervision of normal pregnancy, unspecified, third trimester: Secondary | ICD-10-CM | POA: Diagnosis not present

## 2015-03-05 DIAGNOSIS — IMO0001 Reserved for inherently not codable concepts without codable children: Secondary | ICD-10-CM

## 2015-03-05 DIAGNOSIS — Z331 Pregnant state, incidental: Secondary | ICD-10-CM

## 2015-03-05 LAB — POCT URINALYSIS DIPSTICK
Glucose, UA: NEGATIVE
Ketones, UA: NEGATIVE
Leukocytes, UA: NEGATIVE
NITRITE UA: NEGATIVE
PROTEIN UA: NEGATIVE

## 2015-03-05 LAB — OB RESULTS CONSOLE GBS: GBS: NEGATIVE

## 2015-03-05 NOTE — Discharge Instructions (Signed)
Braxton Hicks Contractions °Contractions of the uterus can occur throughout pregnancy. Contractions are not always a sign that you are in labor.  °WHAT ARE BRAXTON HICKS CONTRACTIONS?  °Contractions that occur before labor are called Braxton Hicks contractions, or false labor. Toward the end of pregnancy (32-34 weeks), these contractions can develop more often and may become more forceful. This is not true labor because these contractions do not result in opening (dilatation) and thinning of the cervix. They are sometimes difficult to tell apart from true labor because these contractions can be forceful and people have different pain tolerances. You should not feel embarrassed if you go to the hospital with false labor. Sometimes, the only way to tell if you are in true labor is for your health care provider to look for changes in the cervix. °If there are no prenatal problems or other health problems associated with the pregnancy, it is completely safe to be sent home with false labor and await the onset of true labor. °HOW CAN YOU TELL THE DIFFERENCE BETWEEN TRUE AND FALSE LABOR? °False Labor °· The contractions of false labor are usually shorter and not as hard as those of true labor.   °· The contractions are usually irregular.   °· The contractions are often felt in the front of the lower abdomen and in the groin.   °· The contractions may go away when you walk around or change positions while lying down.   °· The contractions get weaker and are shorter lasting as time goes on.   °· The contractions do not usually become progressively stronger, regular, and closer together as with true labor.   °True Labor °1. Contractions in true labor last 30-70 seconds, become very regular, usually become more intense, and increase in frequency.   °2. The contractions do not go away with walking.   °3. The discomfort is usually felt in the top of the uterus and spreads to the lower abdomen and low back.   °4. True labor can  be determined by your health care provider with an exam. This will show that the cervix is dilating and getting thinner.   °WHAT TO REMEMBER °· Keep up with your usual exercises and follow other instructions given by your health care provider.   °· Take medicines as directed by your health care provider.   °· Keep your regular prenatal appointments.   °· Eat and drink lightly if you think you are going into labor.   °· If Braxton Hicks contractions are making you uncomfortable:   °· Change your position from lying down or resting to walking, or from walking to resting.   °· Sit and rest in a tub of warm water.   °· Drink 2-3 glasses of water. Dehydration may cause these contractions.   °· Do slow and deep breathing several times an hour.   °WHEN SHOULD I SEEK IMMEDIATE MEDICAL CARE? °Seek immediate medical care if: °· Your contractions become stronger, more regular, and closer together.   °· You have fluid leaking or gushing from your vagina.   °· You have a fever.   °· You pass blood-tinged mucus.   °· You have vaginal bleeding.   °· You have continuous abdominal pain.   °· You have low back pain that you never had before.   °· You feel your baby's head pushing down and causing pelvic pressure.   °· Your baby is not moving as much as it used to.   °Document Released: 08/15/2005 Document Revised: 08/20/2013 Document Reviewed: 05/27/2013 °[employer]® Patient Information ©2015 [employer]. This information is not intended to replace advice given to you by your health care   provider. Make sure you discuss any questions you have with your health care provider. ° °Fetal Movement Counts °Patient Name: __________________________________________________ Patient Due Date: ____________________ °Performing a fetal movement count is highly recommended in high-risk pregnancies, but it is good for every pregnant woman to do. Your health care provider may ask you to start counting fetal movements at 28 weeks of the pregnancy. Fetal  movements often increase: °· After eating a full meal. °· After physical activity. °· After eating or drinking something sweet or cold. °· At rest. °Pay attention to when you feel the baby is most active. This will help you notice a pattern of your baby's sleep and wake cycles and what factors contribute to an increase in fetal movement. It is important to perform a fetal movement count at the same time each day when your baby is normally most active.  °HOW TO COUNT FETAL MOVEMENTS °5. Find a quiet and comfortable area to sit or lie down on your left side. Lying on your left side provides the best blood and oxygen circulation to your baby. °6. Write down the day and time on a sheet of paper or in a journal. °7. Start counting kicks, flutters, swishes, rolls, or jabs in a 2-hour period. You should feel at least 10 movements within 2 hours. °8. If you do not feel 10 movements in 2 hours, wait 2-3 hours and count again. Look for a change in the pattern or not enough counts in 2 hours. °SEEK MEDICAL CARE IF: °· You feel less than 10 counts in 2 hours, tried twice. °· There is no movement in over an hour. °· The pattern is changing or taking longer each day to reach 10 counts in 2 hours. °· You feel the baby is not moving as he or she usually does. °Date: ____________ Movements: ____________ Start time: ____________ Finish time: ____________  °Date: ____________ Movements: ____________ Start time: ____________ Finish time: ____________ °Date: ____________ Movements: ____________ Start time: ____________ Finish time: ____________ °Date: ____________ Movements: ____________ Start time: ____________ Finish time: ____________ °Date: ____________ Movements: ____________ Start time: ____________ Finish time: ____________ °Date: ____________ Movements: ____________ Start time: ____________ Finish time: ____________ °Date: ____________ Movements: ____________ Start time: ____________ Finish time: ____________ °Date: ____________  Movements: ____________ Start time: ____________ Finish time: ____________  °Date: ____________ Movements: ____________ Start time: ____________ Finish time: ____________ °Date: ____________ Movements: ____________ Start time: ____________ Finish time: ____________ °Date: ____________ Movements: ____________ Start time: ____________ Finish time: ____________ °Date: ____________ Movements: ____________ Start time: ____________ Finish time: ____________ °Date: ____________ Movements: ____________ Start time: ____________ Finish time: ____________ °Date: ____________ Movements: ____________ Start time: ____________ Finish time: ____________ °Date: ____________ Movements: ____________ Start time: ____________ Finish time: ____________  °Date: ____________ Movements: ____________ Start time: ____________ Finish time: ____________ °Date: ____________ Movements: ____________ Start time: ____________ Finish time: ____________ °Date: ____________ Movements: ____________ Start time: ____________ Finish time: ____________ °Date: ____________ Movements: ____________ Start time: ____________ Finish time: ____________ °Date: ____________ Movements: ____________ Start time: ____________ Finish time: ____________ °Date: ____________ Movements: ____________ Start time: ____________ Finish time: ____________ °Date: ____________ Movements: ____________ Start time: ____________ Finish time: ____________  °Date: ____________ Movements: ____________ Start time: ____________ Finish time: ____________ °Date: ____________ Movements: ____________ Start time: ____________ Finish time: ____________ °Date: ____________ Movements: ____________ Start time: ____________ Finish time: ____________ °Date: ____________ Movements: ____________ Start time: ____________ Finish time: ____________ °Date: ____________ Movements: ____________ Start time: ____________ Finish time: ____________ °Date: ____________ Movements: ____________ Start time:  ____________ Finish time: ____________ °Date: ____________ Movements:   ____________ Start time: ____________ Finish time: ____________  °Date: ____________ Movements: ____________ Start time: ____________ Finish time: ____________ °Date: ____________ Movements: ____________ Start time: ____________ Finish time: ____________ °Date: ____________ Movements: ____________ Start time: ____________ Finish time: ____________ °Date: ____________ Movements: ____________ Start time: ____________ Finish time: ____________ °Date: ____________ Movements: ____________ Start time: ____________ Finish time: ____________ °Date: ____________ Movements: ____________ Start time: ____________ Finish time: ____________ °Date: ____________ Movements: ____________ Start time: ____________ Finish time: ____________  °Date: ____________ Movements: ____________ Start time: ____________ Finish time: ____________ °Date: ____________ Movements: ____________ Start time: ____________ Finish time: ____________ °Date: ____________ Movements: ____________ Start time: ____________ Finish time: ____________ °Date: ____________ Movements: ____________ Start time: ____________ Finish time: ____________ °Date: ____________ Movements: ____________ Start time: ____________ Finish time: ____________ °Date: ____________ Movements: ____________ Start time: ____________ Finish time: ____________ °Date: ____________ Movements: ____________ Start time: ____________ Finish time: ____________  °Date: ____________ Movements: ____________ Start time: ____________ Finish time: ____________ °Date: ____________ Movements: ____________ Start time: ____________ Finish time: ____________ °Date: ____________ Movements: ____________ Start time: ____________ Finish time: ____________ °Date: ____________ Movements: ____________ Start time: ____________ Finish time: ____________ °Date: ____________ Movements: ____________ Start time: ____________ Finish time: ____________ °Date:  ____________ Movements: ____________ Start time: ____________ Finish time: ____________ °Date: ____________ Movements: ____________ Start time: ____________ Finish time: ____________  °Date: ____________ Movements: ____________ Start time: ____________ Finish time: ____________ °Date: ____________ Movements: ____________ Start time: ____________ Finish time: ____________ °Date: ____________ Movements: ____________ Start time: ____________ Finish time: ____________ °Date: ____________ Movements: ____________ Start time: ____________ Finish time: ____________ °Date: ____________ Movements: ____________ Start time: ____________ Finish time: ____________ °Date: ____________ Movements: ____________ Start time: ____________ Finish time: ____________ °Document Released: 09/14/2006 Document Revised: 12/30/2013 Document Reviewed: 06/11/2012 °[employer]® Patient Information ©2015 [employer]. This information is not intended to replace advice given to you by your health care provider. Make sure you discuss any questions you have with your health care provider. ° °

## 2015-03-05 NOTE — Progress Notes (Addendum)
[id number] 8060w5d Estimated Date of Delivery: 03/14/15  Blood pressure 120/80, pulse 80, weight 171 lb (77.565 kg).   refer to the ob flow sheet for FH and FHR, also BP, Wt, Urine results:negative   Patient reports good fetal movement, denies any bleeding and no rupture of membranes symptoms or regular contractions. Patient complaints:  Pt complains of intermittent vaginal discharge and small brown clots for the past 3 days. Pt states that she has been having contractions every four minutes for the past 2.5 hours. Pt states that she is having associated pain and pressure.  Questions were answered. Assessment:  Prodromal labor. Expect slow progress over next few days Plan:  Continued routine obstetrical care,             appt 1 wk F/u in 1 week for routine prenatal care  This chart was scribed by Leone PayorSonum Patel, Medical Scribe, for Dr. Christin BachJohn Nikie Cid on  03/05/15 at 11:02 AM.  I personally performed the services described in this documentation, which was SCRIBED in my presence. The recorded information has been reviewed and considered accurate. It has been edited as necessary during review. Tilda BurrowFERGUSON,Carlester Kasparek V, MD

## 2015-03-05 NOTE — Progress Notes (Signed)
Pt states that she has had clotting the size of her pinky nail on and off since Monday. Pt states that the clotting is brown in color. Pt states that she has been having contractions every four minutes since around 8:30 this morning. Pt states that she is having a lot of pain and pressure.

## 2015-03-05 NOTE — MAU Note (Signed)
Contractions q 5-6 minutes, some bleeding x 3 days.

## 2015-03-06 ENCOUNTER — Inpatient Hospital Stay (HOSPITAL_COMMUNITY): Payer: Medicaid Other | Admitting: Anesthesiology

## 2015-03-06 ENCOUNTER — Encounter: Payer: BC Managed Care – PPO | Admitting: Obstetrics & Gynecology

## 2015-03-06 ENCOUNTER — Encounter (HOSPITAL_COMMUNITY): Payer: Self-pay

## 2015-03-06 DIAGNOSIS — IMO0001 Reserved for inherently not codable concepts without codable children: Secondary | ICD-10-CM

## 2015-03-06 DIAGNOSIS — O99334 Smoking (tobacco) complicating childbirth: Secondary | ICD-10-CM | POA: Diagnosis not present

## 2015-03-06 DIAGNOSIS — Z3A38 38 weeks gestation of pregnancy: Secondary | ICD-10-CM | POA: Diagnosis present

## 2015-03-06 LAB — CBC
HCT: 32.9 % — ABNORMAL LOW (ref 36.0–46.0)
Hemoglobin: 11.1 g/dL — ABNORMAL LOW (ref 12.0–15.0)
MCH: 29.5 pg (ref 26.0–34.0)
MCHC: 33.7 g/dL (ref 30.0–36.0)
MCV: 87.5 fL (ref 78.0–100.0)
PLATELETS: 218 10*3/uL (ref 150–400)
RBC: 3.76 MIL/uL — ABNORMAL LOW (ref 3.87–5.11)
RDW: 13.5 % (ref 11.5–15.5)
WBC: 12.9 10*3/uL — ABNORMAL HIGH (ref 4.0–10.5)

## 2015-03-06 LAB — RPR: RPR Ser Ql: NONREACTIVE

## 2015-03-06 LAB — TYPE AND SCREEN
ABO/RH(D): A POS
Antibody Screen: NEGATIVE

## 2015-03-06 LAB — ABO/RH: ABO/RH(D): A POS

## 2015-03-06 MED ORDER — LANOLIN HYDROUS EX OINT
TOPICAL_OINTMENT | CUTANEOUS | Status: DC | PRN
Start: 2015-03-06 — End: 2015-03-08

## 2015-03-06 MED ORDER — FENTANYL 2.5 MCG/ML BUPIVACAINE 1/10 % EPIDURAL INFUSION (WH - ANES)
14.0000 mL/h | INTRAMUSCULAR | Status: DC | PRN
  Administered 2015-03-06: 14 mL/h via EPIDURAL
  Administered 2015-03-06: 12 mL/h via EPIDURAL
  Filled 2015-03-06 (×2): qty 125

## 2015-03-06 MED ORDER — PHENYLEPHRINE 40 MCG/ML (10ML) SYRINGE FOR IV PUSH (FOR BLOOD PRESSURE SUPPORT)
80.0000 ug | PREFILLED_SYRINGE | INTRAVENOUS | Status: DC | PRN
  Filled 2015-03-06: qty 2
  Filled 2015-03-06: qty 20

## 2015-03-06 MED ORDER — OXYCODONE-ACETAMINOPHEN 5-325 MG PO TABS: 2.0000 | ORAL_TABLET | ORAL | Status: DC | PRN

## 2015-03-06 MED ORDER — ACETAMINOPHEN 325 MG PO TABS: 650.0000 mg | ORAL_TABLET | ORAL | Status: DC | PRN

## 2015-03-06 MED ORDER — LIDOCAINE-EPINEPHRINE (PF) 2 %-1:200000 IJ SOLN
INTRAMUSCULAR | Status: DC | PRN
  Administered 2015-03-06: 3 mL

## 2015-03-06 MED ORDER — PRENATAL MULTIVITAMIN CH
1.0000 | ORAL_TABLET | Freq: Every day | ORAL | Status: DC
  Administered 2015-03-07 – 2015-03-08 (×2): 1 via ORAL
  Filled 2015-03-06 (×2): qty 1

## 2015-03-06 MED ORDER — SENNOSIDES-DOCUSATE SODIUM 8.6-50 MG PO TABS
2.0000 | ORAL_TABLET | ORAL | Status: DC
  Filled 2015-03-06 (×2): qty 2

## 2015-03-06 MED ORDER — OXYTOCIN 40 UNITS IN LACTATED RINGERS INFUSION - SIMPLE MED
62.5000 mL/h | INTRAVENOUS | Status: DC
  Administered 2015-03-06: 62.5 mL/h via INTRAVENOUS

## 2015-03-06 MED ORDER — LIDOCAINE HCL (PF) 1 % IJ SOLN
30.0000 mL | INTRAMUSCULAR | Status: DC | PRN
  Filled 2015-03-06: qty 30

## 2015-03-06 MED ORDER — OXYCODONE-ACETAMINOPHEN 5-325 MG PO TABS: 1.0000 | ORAL_TABLET | ORAL | Status: DC | PRN

## 2015-03-06 MED ORDER — DIBUCAINE 1 % RE OINT: 1.0000 "application " | TOPICAL_OINTMENT | RECTAL | Status: DC | PRN

## 2015-03-06 MED ORDER — BENZOCAINE-MENTHOL 20-0.5 % EX AERO
1.0000 "application " | INHALATION_SPRAY | CUTANEOUS | Status: DC | PRN
  Administered 2015-03-06 – 2015-03-07 (×2): 1 via TOPICAL
  Filled 2015-03-06 (×2): qty 56

## 2015-03-06 MED ORDER — ONDANSETRON HCL 4 MG/2ML IJ SOLN: 4.0000 mg | INTRAMUSCULAR | Status: DC | PRN

## 2015-03-06 MED ORDER — BUPIVACAINE HCL (PF) 0.25 % IJ SOLN
INTRAMUSCULAR | Status: DC | PRN
  Administered 2015-03-06 (×2): 4 mL

## 2015-03-06 MED ORDER — ZOLPIDEM TARTRATE 5 MG PO TABS: 5.0000 mg | ORAL_TABLET | Freq: Every evening | ORAL | Status: DC | PRN

## 2015-03-06 MED ORDER — LACTATED RINGERS IV SOLN
500.0000 mL | INTRAVENOUS | Status: DC | PRN
  Administered 2015-03-06: 500 mL via INTRAVENOUS

## 2015-03-06 MED ORDER — FLEET ENEMA 7-19 GM/118ML RE ENEM: 1.0000 | ENEMA | RECTAL | Status: DC | PRN

## 2015-03-06 MED ORDER — TETANUS-DIPHTH-ACELL PERTUSSIS 5-2.5-18.5 LF-MCG/0.5 IM SUSP: 0.5000 mL | Freq: Once | INTRAMUSCULAR | Status: DC

## 2015-03-06 MED ORDER — SIMETHICONE 80 MG PO CHEW: 80.0000 mg | CHEWABLE_TABLET | ORAL | Status: DC | PRN

## 2015-03-06 MED ORDER — ONDANSETRON HCL 4 MG/2ML IJ SOLN
4.0000 mg | Freq: Four times a day (QID) | INTRAMUSCULAR | Status: DC | PRN
  Administered 2015-03-06 (×3): 4 mg via INTRAVENOUS
  Filled 2015-03-06 (×3): qty 2

## 2015-03-06 MED ORDER — ONDANSETRON HCL 4 MG PO TABS
4.0000 mg | ORAL_TABLET | ORAL | Status: DC | PRN
  Administered 2015-03-07: 4 mg via ORAL
  Filled 2015-03-06: qty 1

## 2015-03-06 MED ORDER — OXYTOCIN BOLUS FROM INFUSION: 500.0000 mL | INTRAVENOUS | Status: DC

## 2015-03-06 MED ORDER — DIPHENHYDRAMINE HCL 25 MG PO CAPS: 25.0000 mg | ORAL_CAPSULE | Freq: Four times a day (QID) | ORAL | Status: DC | PRN

## 2015-03-06 MED ORDER — OXYTOCIN 40 UNITS IN LACTATED RINGERS INFUSION - SIMPLE MED
1.0000 m[IU]/min | INTRAVENOUS | Status: DC
  Administered 2015-03-06: 1 m[IU]/min via INTRAVENOUS
  Administered 2015-03-06: 999 m[IU]/min via INTRAVENOUS
  Filled 2015-03-06: qty 1000

## 2015-03-06 MED ORDER — CITRIC ACID-SODIUM CITRATE 334-500 MG/5ML PO SOLN: 30.0000 mL | ORAL | Status: DC | PRN

## 2015-03-06 MED ORDER — WITCH HAZEL-GLYCERIN EX PADS: 1.0000 "application " | MEDICATED_PAD | CUTANEOUS | Status: DC | PRN

## 2015-03-06 MED ORDER — IBUPROFEN 600 MG PO TABS
600.0000 mg | ORAL_TABLET | Freq: Four times a day (QID) | ORAL | Status: DC
  Administered 2015-03-07 – 2015-03-08 (×7): 600 mg via ORAL
  Filled 2015-03-06 (×7): qty 1

## 2015-03-06 MED ORDER — LACTATED RINGERS IV SOLN
INTRAVENOUS | Status: DC
  Administered 2015-03-06 (×2): via INTRAVENOUS

## 2015-03-06 MED ORDER — TERBUTALINE SULFATE 1 MG/ML IJ SOLN
0.2500 mg | Freq: Once | INTRAMUSCULAR | Status: AC | PRN
  Filled 2015-03-06: qty 1

## 2015-03-06 MED ORDER — EPHEDRINE 5 MG/ML INJ
10.0000 mg | INTRAVENOUS | Status: DC | PRN
  Filled 2015-03-06: qty 2

## 2015-03-06 MED ORDER — DIPHENHYDRAMINE HCL 50 MG/ML IJ SOLN: 12.5000 mg | INTRAMUSCULAR | Status: DC | PRN

## 2015-03-06 MED ORDER — ACETAMINOPHEN 325 MG PO TABS
650.0000 mg | ORAL_TABLET | ORAL | Status: DC | PRN
  Administered 2015-03-06: 650 mg via ORAL
  Filled 2015-03-06: qty 2

## 2015-03-06 NOTE — Anesthesia Preprocedure Evaluation (Signed)
Anesthesia Evaluation  Patient identified by MRN, date of birth, ID band Patient awake    Reviewed: Allergy & Precautions, Patient's Chart, lab work & pertinent test results  History of Anesthesia Complications Negative for: history of anesthetic complications  Airway Mallampati: II  TM Distance: >3 FB Neck ROM: Full    Dental  (+) Teeth Intact   Pulmonary Current Smoker,  breath sounds clear to auscultation        Cardiovascular negative cardio ROS  Rhythm:Regular     Neuro/Psych negative neurological ROS  negative psych ROS   GI/Hepatic negative GI ROS, Neg liver ROS,   Endo/Other  negative endocrine ROS  Renal/GU negative Renal ROS     Musculoskeletal   Abdominal   Peds  Hematology negative hematology ROS (+)   Anesthesia Other Findings   Reproductive/Obstetrics (+) Pregnancy                             Anesthesia Physical Anesthesia Plan  ASA: II  Anesthesia Plan: Epidural   Post-op Pain Management:    Induction:   Airway Management Planned:   Additional Equipment:   Intra-op Plan:   Post-operative Plan:   Informed Consent: I have reviewed the patients History and Physical, chart, labs and discussed the procedure including the risks, benefits and alternatives for the proposed anesthesia with the patient or authorized representative who has indicated his/her understanding and acceptance.   Dental advisory given  Plan Discussed with: Anesthesiologist  Anesthesia Plan Comments:         Anesthesia Quick Evaluation

## 2015-03-06 NOTE — Progress Notes (Signed)
   Kristina Robbins is a 19 y.o. G3P0020 at 5971w6d  admitted for active labor  Subjective:  Comfortable with epidural Objective: Filed Vitals:   03/06/15 0530 03/06/15 0600 03/06/15 0630 03/06/15 0648  BP: 115/72 101/63 117/68   Pulse: 100 90 88   Temp:    98 F (36.7 C)  TempSrc:    Oral  Resp: 18 18 18    Height:      Weight:      SpO2:          FHT:  FHR: 140 bpm, variability: moderate,  accelerations:  Present,  decelerations:  Absent UC:   irregular, every 3-5 minutes, spaced out SVE:   Dilation: 5 Effacement (%): 90 Station: -2 Exam by:: Cresenzo-Dishmon, CNM AROM BBOW with clear fluid  Labs: Lab Results  Component Value Date   WBC 12.9* 03/06/2015   HGB 11.1* 03/06/2015   HCT 32.9* 03/06/2015   MCV 87.5 03/06/2015   PLT 218 03/06/2015    Assessment / Plan: Augmentation of labor, progressing well  Labor: Progressing normally Fetal Wellbeing:  Category I Pain Control:  Epidural Anticipated MOD:  NSVD  CRESENZO-DISHMAN,Kristina Robbins 03/06/2015, 7:11 AM

## 2015-03-06 NOTE — H&P (Signed)
Kristina Robbins is a 19 y.o. female G3P0020 at [redacted]w[redacted]d by LMP c/w 12 wk [place] presenting for regular contractions.  Patient has had regular contractions q4 min since 9:30pm.  Also reports bloody show today.  +FM, denies LOF, vaginal discharge.   Cervix was 1.5cm last night in MAU, 2cm in clinic earlier today and now 4.5cm.   [facility]  Initiated Care at  12 weeks  FOB [facility]  Dating By LMP/12 week [place]                             Pap [facility]/14 [facility]:  normal  GC/CT Initial:     -/-           36+wks:  Genetic Screen NT/IT: normal  CF screen declined  Anatomic [place] Female, unable to view OFTs, Lt EICF, Recheck @ 28wks: normal OFTs, EICF remains  Flu vaccine 2.11.16  Tdap Recommended ~ 28wks  Glucose Screen  2 hr  74/124/92  GBS  neg  Feed Preference breast  Contraception nexplanon  Circumcision yes  Childbirth Classes declined  Pediatrician Undecided, info given    Maternal Medical History:  Reason for admission: Nausea.    OB History    Gravida Para Term Preterm AB TAB SAB Ectopic Multiple Living   3 0   2  2   0     Past Medical History  Diagnosis Date  . Irritable bowel syndrome   . Cystic fibroadenosis of breast    Past Surgical History  Procedure Laterality Date  . No past surgeries     Family History: family history includes Autism in her brother; Cystic fibrosis in her mother. Social History:  reports that she has been smoking.  She has never used smokeless tobacco. She reports that she does not drink alcohol or use illicit drugs.   Prenatal Transfer Tool  Maternal Diabetes: No Genetic Screening: Normal Maternal Ultrasounds/Referrals: Abnormal:  Findings:   Isolated EIF (echogenic intracardiac focus) Fetal Ultrasounds or other Referrals:  None Maternal Substance Abuse:  No Significant Maternal Medications:  None Significant Maternal Lab Results:  Lab values include: Group B Strep negative Other Comments:  None  Review of Systems   Constitutional: Negative for fever, chills and weight loss.  HENT: Negative for congestion and sore throat.   Eyes: Negative for blurred vision, double vision, photophobia and pain.  Respiratory: Negative for cough, shortness of breath and wheezing.   Cardiovascular: Negative for chest pain, palpitations and leg swelling.  Gastrointestinal: Negative for heartburn, nausea and vomiting.  Genitourinary: Negative for dysuria, urgency and frequency.  Musculoskeletal: Negative for myalgias, falls and neck pain.  Skin: Negative for itching and rash.  Neurological: Negative.  Negative for headaches.  Endo/Heme/Allergies: Negative.   Psychiatric/Behavioral: Negative.     Dilation: 4.5 Effacement (%): 90 Station: -2 Exam by:: Pharmacologist Blood pressure 129/86, pulse 114, temperature 98 F (36.7 C), temperature source Oral, resp. rate 18. Maternal Exam:  Uterine Assessment: Contraction strength is moderate.  Contraction frequency is regular.   Abdomen: Patient reports no abdominal tenderness. Fetal presentation: vertex  Introitus: Normal vulva. Normal vagina.  Pelvis: adequate for delivery.      Fetal Exam Fetal Monitor Review: Baseline rate: 140.  Variability: moderate (6-25 bpm).   Pattern: accelerations present and no decelerations.    Fetal State Assessment: Category I - tracings are normal.     Physical Exam  Constitutional: She is oriented  to person, place, and time. She appears well-developed and well-nourished. No distress.  HENT:  Head: Normocephalic and atraumatic.  Eyes: EOM are normal. Pupils are equal, round, and reactive to light. No scleral icterus.  Neck: Normal range of motion. Neck supple.  Cardiovascular: Normal rate, regular rhythm, normal heart sounds and intact distal pulses.   No murmur heard. Respiratory: Effort normal and breath sounds normal. No respiratory distress.  GI: Soft. Bowel sounds are normal.  Genitourinary: Vagina normal.   Musculoskeletal: Normal range of motion. She exhibits no edema or tenderness.  Neurological: She is alert and oriented to person, place, and time. No cranial nerve deficit.  Skin: Skin is warm and dry. No rash noted.  Psychiatric: She has a normal mood and affect. Her behavior is normal. Thought content normal.    Prenatal labs: ABO, Rh: A/Positive/-- (12/08 0000) Antibody: Negative (04/28 0858) Rubella: Immune (12/08 0000) RPR: Non Reactive (04/28 0858)  HBsAg: Negative (12/08 0000)  HIV: Non-reactive (12/08 0000)  GBS: Negative (07/07 0000)   Assessment/Plan: Kristina Robbins is a 19 y.o. G3P0020 at 5824w6d here for SOL.  #Labor:expectant management #Pain: Epidural upon request #FWB: Cat 1 #ID:  GBS neg #MOF: Breast #MOC:deciding between Nexplanon/Mirena #Circ:  Desires OP circ at FT   Erasmo DownerAngela M Bacigalupo, MD, MPH PGY-2,  [facility] 03/06/2015 12:40 AM   I have seen and examined this patient and agree the above assessment. CRESENZO-DISHMAN,Demira Gwynne 03/06/2015 1:02 AM

## 2015-03-06 NOTE — Lactation Note (Signed)
This note was copied from the chart of Kristina Robbins. Lactation Consultation Note  Patient Name: Kristina Bartholomew BoardsBrittany Summerhill Today's Date: 03/06/2015 Reason for consult: Initial assessment RN requested LC to see pt. Mom requesting formula with baby at 6 hours of age. [facility] is concerned baby not getting anything at the breast. She could not hand express any colostrum. LC assisted Mom with hand expression and several drops of colostrum present. LC assisted Mom with positioning and latching baby with good depth. Basic teaching reviewed. Encouraged to keep baby at the breast, discussed risk of early supplementation to BF success. Advised to BF with feeding ques, cluster feeding reviewed. Lactation brochure left for review, advised of OP services and support group. Encouraged to call for assist as needed.   Maternal Data Has patient been taught Hand Expression?: Yes Does the patient have breastfeeding experience prior to this delivery?: No  Feeding Feeding Type: Breast Fed Length of feed: 15 min  LATCH Score/Interventions Latch: Grasps breast easily, tongue down, lips flanged, rhythmical sucking.  Audible Swallowing: A few with stimulation Intervention(s): Skin to skin;Hand expression  Type of Nipple: Everted at rest and after stimulation  Comfort (Breast/Nipple): Soft / non-tender     Hold (Positioning): Assistance needed to correctly position infant at breast and maintain latch. Intervention(s): Breastfeeding basics reviewed;Support Pillows;Position options;Skin to skin  LATCH Score: 8  Lactation Tools Discussed/Used WIC Program: No   Consult Status Consult Status: Follow-up Date: 03/07/15 Follow-up type: In-patient    Alfred LevinsGranger, Harly Pipkins Ann 03/06/2015, 10:38 PM

## 2015-03-06 NOTE — Anesthesia Procedure Notes (Signed)
Epidural Patient location during procedure: OB  Staffing Anesthesiologist: Shaunae Sieloff, CHRIS Performed by: anesthesiologist   Preanesthetic Checklist Completed: patient identified, surgical consent, pre-op evaluation, timeout performed, IV checked, risks and benefits discussed and monitors and equipment checked  Epidural Patient position: sitting Prep: site prepped and draped and DuraPrep Patient monitoring: heart rate, cardiac monitor, continuous pulse ox and blood pressure Approach: midline Location: L3-L4 Injection technique: LOR saline  Needle:  Needle type: Tuohy  Needle gauge: 17 G Needle length: 9 cm Needle insertion depth: 6 cm Catheter type: closed end flexible Catheter size: 19 Gauge Catheter at skin depth: 12 cm Test dose: negative and 2% lidocaine with Epi 1:200 K  Assessment Events: blood not aspirated, injection not painful, no injection resistance, negative IV test and no paresthesia  Additional Notes H+P and labs checked, risks and benefits discussed with the patient, consent obtained, procedure tolerated well and without complications.  Reason for block:procedure for pain   

## 2015-03-06 NOTE — MAU Note (Signed)
Pt is here with c/o contractions since last night. Reports some bloody show today. Denies any leaking of fluid. Cervix was 2/75 in the office today. Reports positive fetal movement. Reports that she "tried to give herself an enema today" about 1500 and has been hurting worse since then.

## 2015-03-07 NOTE — Lactation Note (Signed)
This note was copied from the chart of Kristina Czech RepublicBrittany Charbonnet. Lactation Consultation Note New mom breast are sore. Baby starting to cluster feed per mom. Baby has been gaggy, had lots of stools and 1 void. Mom has been asking for formula since soon after birth. Cont. To ask for it as supplementing and getting irritable. States "baby isn't getting enough and my nipples are sore and I want a bottle"! RN Elon JesterMichele had discussed supply and demand, then I went into room and talked to mom. Hand expressed to show colostrum. Set up DEBP d/t fibradenosis to post pump. Mom stating her nipples and breast are sore. Assessed nipples, short shaft nipples are slightly red, everts out more w/stimulation. Has comfort gels, encouraged to pat colostrum to nipples. Gave shells to wear in bra after shower to evert more and also prevent from rubbing in bra d/t tenderness. Mom post-pumped and demonstrated how to clean and set up. Mom saw a drop of colostrum and was excited. Stressed the fact not to get discouraged if she didn't get anything during pumping, explained the thickness of colostrum. Mom wanting formula. Gave supplementing feeding sheet of limits, discussed spitting, measuring feeding amount. Mom wanted nipple and not supplement any other way. Stated has bottles at home. Gave measuring cup for accurate feeding amount. Encouraged to BF always first and only if baby appears not to be satisfied. Patient Name: Kristina Robbins [id number]'WToday's Date: 03/07/2015 Reason for consult: Follow-up assessment;Breast/nipple pain   Maternal Data    Feeding Feeding Type: Formula Nipple Type: Slow - flow  LATCH Score/Interventions Latch: Grasps breast easily, tongue down, lips flanged, rhythmical sucking.  Audible Swallowing: A few with stimulation Intervention(s): Skin to skin  Type of Nipple: Everted at rest and after stimulation  Comfort (Breast/Nipple): Filling, red/small blisters or bruises, mild/mod discomfort  Problem  noted: Mild/Moderate discomfort Interventions (Mild/moderate discomfort): Comfort gels;Post-pump;Hand massage;Hand expression  Hold (Positioning): Assistance needed to correctly position infant at breast and maintain latch. Intervention(s): Skin to skin;Position options;Support Pillows;Breastfeeding basics reviewed  LATCH Score: 7  Lactation Tools Discussed/Used Tools: Shells;Pump;Comfort gels Shell Type: Inverted Breast pump type: Double-Electric Breast Pump Pump Review: Setup, frequency, and cleaning;Milk Storage Initiated by:: Peri JeffersonL. Verle Wheeling RN Date initiated:: 03/07/15   Consult Status Consult Status: Follow-up Date: 03/08/15 Follow-up type: In-patient    [name], [name] 03/07/2015, 9:52 AM

## 2015-03-07 NOTE — Anesthesia Postprocedure Evaluation (Signed)
  Anesthesia Post-op Note  Patient: Kristina Robbins  Procedure(s) Performed: * No procedures listed *  Patient Location: Mother/Baby  Anesthesia Type:Epidural  Level of Consciousness: awake  Airway and Oxygen Therapy: Patient Spontanous Breathing  Post-op Pain: mild  Post-op Assessment: Patient's Cardiovascular Status Stable and Respiratory Function Stable              Post-op Vital Signs: stable  Last Vitals:  Filed Vitals:   03/07/15 0635  BP: 96/54  Pulse: 75  Temp: 36.6 C  Resp: 18    Complications: No apparent anesthesia complications

## 2015-03-08 NOTE — Discharge Summary (Signed)
Obstetric Discharge Summary Reason for Admission: onset of labor Prenatal Procedures: ultrasound Intrapartum Procedures: spontaneous vaginal delivery Postpartum Procedures: none Complications-Operative and Postpartum: vaginal laceration HEMOGLOBIN  Date Value Ref Range Status  03/06/2015 11.1* 12.0 - 15.0 g/dL Final  09/81/191412/03/2014 78.212.6 g/dL Final   HCT  Date Value Ref Range Status  03/06/2015 32.9* 36.0 - 46.0 % Final  08/05/2014 37 % Final   HEMATOCRIT  Date Value Ref Range Status  12/25/2014 29.8* 34.0 - 46.6 % Final    Physical Exam:  General: alert, cooperative, appears stated age and no distress Lochia: appropriate Uterine Fundus: firm and low Incision: n/a DVT Evaluation: No evidence of DVT seen on physical exam. Negative Homan's sign. No cords or calf tenderness. No significant calf/ankle edema.  Discharge Diagnoses: Term Pregnancy-delivered  Discharge Information: Date: 03/08/2015 Activity: pelvic rest Diet: routine Medications: PNV and Ibuprofen Condition: stable Instructions: refer to practice specific booklet Discharge to: home Follow-up Information    [facility].   Specialty:  Obstetrics and Gynecology   Contact information:   [address] [place] [id number] [phone]      Newborn Data: Live born female  Birth Weight: 7 lb 10.2 oz (3464 g) APGAR: 9, 9  Home with mother.  Parks Rangermily O'Mara 03/08/2015, 8:51 AM

## 2015-03-08 NOTE — Discharge Instructions (Signed)

## 2015-03-08 NOTE — Lactation Note (Signed)
This note was copied from the chart of Boy Czech RepublicBrittany Winnie. Lactation Consultation Note  Patient Name: Boy Bartholomew BoardsBrittany Waterfield Today's Date: 03/08/2015 Reason for consult:  (mom declined LC F/U per Verlon SettingMichelle Khan prior to D/C and isn't using the NS )   Maternal Data    Feeding Feeding Type: Breast Fed  LATCH Score/Interventions Latch: Grasps breast easily, tongue down, lips flanged, rhythmical sucking.  Audible Swallowing: A few with stimulation  Type of Nipple: Everted at rest and after stimulation  Comfort (Breast/Nipple): Filling, red/small blisters or bruises, mild/mod discomfort  Problem noted: Mild/Moderate discomfort Interventions (Mild/moderate discomfort): Comfort gels (hand expressed breastmilk to nipples)  Hold (Positioning): No assistance needed to correctly position infant at breast. Intervention(s): Support Pillows;Breastfeeding basics reviewed  LATCH Score: 8  Lactation Tools Discussed/Used     Consult Status Consult Status: Complete Date: 03/08/15    Kathrin Greathouseorio, Esme Freund Ann 03/08/2015, 11:46 AM

## 2015-03-11 ENCOUNTER — Ambulatory Visit: Payer: BC Managed Care – PPO | Admitting: Women's Health

## 2015-03-12 ENCOUNTER — Encounter: Payer: Self-pay | Admitting: Obstetrics & Gynecology

## 2015-03-12 ENCOUNTER — Ambulatory Visit (INDEPENDENT_AMBULATORY_CARE_PROVIDER_SITE_OTHER): Payer: BC Managed Care – PPO | Admitting: Obstetrics & Gynecology

## 2015-03-12 VITALS — BP 120/70 | HR 76 | Wt 159.0 lb

## 2015-03-12 DIAGNOSIS — O92119 Cracked nipple associated with pregnancy, unspecified trimester: Secondary | ICD-10-CM | POA: Diagnosis not present

## 2015-03-12 NOTE — Progress Notes (Signed)
Patient ID: Kristina Robbins, female   DOB: 1996-04-02, 19 y.o.   MRN: [id number] Chief Complaint  Patient presents with  . Routine Post Op    1 week vaginal delivery.c/c check rt breast, swollen/ cracked.    Blood pressure 120/70, pulse 76, weight 159 lb (72.122 kg), unknown if currently breastfeeding.   Nipples are ok not infected  Recommend vit e topically     Face to face time:  10 minutes  Greater than 50% of the visit time was spent in counseling and coordination of care with the patient.  The summary and outline of the counseling and care coordination is summarized in the note above.   All questions were answered.

## 2015-03-13 ENCOUNTER — Encounter: Payer: BC Managed Care – PPO | Admitting: Obstetrics & Gynecology

## 2015-03-31 ENCOUNTER — Telehealth: Payer: Self-pay | Admitting: *Deleted

## 2015-03-31 NOTE — Telephone Encounter (Signed)
Pt states she is 4 weeks postpartum and would like to go swimming in her Grandmothers pool. Pt states she is still bleeding some and does have a labia tear with stitches. Pt advised against going swimming at this time due to possible infection. Pt verbalized understanding.

## 2015-04-06 ENCOUNTER — Telehealth: Payer: Self-pay | Admitting: Advanced Practice Midwife

## 2015-04-06 NOTE — Telephone Encounter (Signed)
Pt states had bilateral labia tears from delivery on 03/06/2015, she states her stitches was accidentally pulled out, no redness, or bleeding. Pt states labia appear healed. Pt states she has an appt April 08, 2015. Per Dr. Emelda Fear ok for pt to wait to be seen on 04/08/2015. Pt verbalized understanding.

## 2015-04-08 ENCOUNTER — Other Ambulatory Visit: Payer: Self-pay | Admitting: *Deleted

## 2015-04-08 ENCOUNTER — Ambulatory Visit (INDEPENDENT_AMBULATORY_CARE_PROVIDER_SITE_OTHER): Payer: BC Managed Care – PPO | Admitting: Advanced Practice Midwife

## 2015-04-08 ENCOUNTER — Encounter: Payer: Self-pay | Admitting: Advanced Practice Midwife

## 2015-04-08 MED ORDER — NORETHINDRONE-ETH ESTRADIOL 1-35 MG-MCG PO TABS: 1.0000 | ORAL_TABLET | Freq: Every day | ORAL | Status: DC

## 2015-04-08 MED ORDER — METRONIDAZOLE 0.75 % VA GEL: VAGINAL | Status: DC

## 2015-04-08 NOTE — Telephone Encounter (Signed)
Verbal order given for Metrogel cream 0.75 % apply BID to face for Roseca, 70 G, 1 refill. Pt PA denied for Metronidazole 0.75% gel. RX called to [facility] [place].   Left message for pt to call our office back in regards to the medication change.

## 2015-04-08 NOTE — Progress Notes (Signed)
  Kristina Robbins is a 19 y.o. who presents for a postpartum visit. She is 5 weeks postpartum following a spontaneous vaginal delivery. I have fully reviewed the prenatal and intrapartum course. The delivery was at 38.6 gestational weeks.  Anesthesia: epidural. Postpartum course has been unevnetful. Baby's course has been uneventful. Baby is feeding by bottle. Bleeding: no bleeding. Bowel function is normal. Bladder function is normal. Patient is not sexually active. Contraception method is unevnetfulnone. Postpartum depression screening: 11.  Pt denies SI/HI. Lost grandmother and uncle in the first 2 weeks after birth.   declines counseling referral. (States "counseling doesn't work for me"). C/O HA's for "years",  Has never seen a specialist, takes ibuprofen.    Current outpatient prescriptions:  .  acetaminophen (TYLENOL) 500 MG tablet, Take 500 mg by mouth every 6 (six) hours as needed for mild pain. , Disp: , Rfl:  .  Ca Carbonate-Mag Hydroxide (ROLAIDS PO), Take 1-2 tablets by mouth as needed (heartburn)., Disp: , Rfl:  .  flintstones complete (FLINTSTONES) 60 MG chewable tablet, Chew 2 tablets by mouth daily., Disp: , Rfl:  .  omeprazole (PRILOSEC) 20 MG capsule, Take 20 mg by mouth daily as needed (for heartburn). , Disp: , Rfl:   Review of Systems   Constitutional: Negative for fever and chills Eyes: Negative for visual disturbances Respiratory: Negative for shortness of breath, dyspnea Cardiovascular: Negative for chest pain or palpitations  Gastrointestinal: Negative for vomiting, diarrhea and constipation Genitourinary: Negative for dysuria and urgency Musculoskeletal: Negative for back pain, joint pain, myalgias  Neurological: Negative for dizziness   Objective:     Filed Vitals:   04/08/15 1139  BP: 110/70  Pulse: 72   General:  alert, cooperative and no distress   Breasts:  negative  Lungs: clear to auscultation bilaterally  Heart:  regular rate and rhythm   Abdomen: Soft, nontender   Vulva:  normal  Vagina: normal vagina  Cervix:  closed  Corpus: Well involuted     Rectal Exam: no hemorrhoids        Assessment:    normal postpartum exam.  Plan:    1. Contraception: IUD and OCP (estrogen/progesterone) Start when the baby is 52 weeks old.  Rx metrogel for rosacea 2.  [facility] 3. Follow up in:   or as needed.

## 2015-04-08 NOTE — Patient Instructions (Addendum)
[facility]:  [phone]

## 2015-04-10 ENCOUNTER — Telehealth: Payer: Self-pay | Admitting: *Deleted

## 2015-04-10 NOTE — Telephone Encounter (Signed)
Per Cathie Beams, CNM OK for pt to get Metrogel 0.75% cream, 70 g  apply to face BID for Roseca, with 1 refill. [facility]bal [facility] [place]. Pt informed her insurance would not cover the Metronidazole 0.75 % gel but did cover Metrogel 0.75% cream. Pt can pick Rx up at pharmacy.

## 2015-04-27 ENCOUNTER — Ambulatory Visit: Payer: BC Managed Care – PPO | Admitting: Obstetrics & Gynecology

## 2015-05-22 ENCOUNTER — Encounter: Payer: Self-pay | Admitting: Adult Health

## 2015-05-22 ENCOUNTER — Ambulatory Visit (INDEPENDENT_AMBULATORY_CARE_PROVIDER_SITE_OTHER): Payer: BC Managed Care – PPO | Admitting: Adult Health

## 2015-05-22 VITALS — BP 120/76 | HR 104 | Ht 63.0 in | Wt 154.0 lb

## 2015-05-22 DIAGNOSIS — N926 Irregular menstruation, unspecified: Secondary | ICD-10-CM | POA: Diagnosis not present

## 2015-05-22 DIAGNOSIS — Z3202 Encounter for pregnancy test, result negative: Secondary | ICD-10-CM

## 2015-05-22 DIAGNOSIS — Z3200 Encounter for pregnancy test, result unknown: Secondary | ICD-10-CM

## 2015-05-22 HISTORY — DX: Irregular menstruation, unspecified: N92.6

## 2015-05-22 LAB — POCT URINE PREGNANCY: Preg Test, Ur: NEGATIVE

## 2015-05-22 NOTE — Patient Instructions (Signed)
No sex til after nexplanon inserted Come back 10/6 for stat [facility] in am and then nexplanon in pm

## 2015-05-22 NOTE — Progress Notes (Signed)
Subjective:     Patient ID: Kristina Robbins, female   DOB: [birth year], 19 y.o.   MRN: [id number]  HPI Kristina Robbins is a 19 year old white female, married in for having missed a period and had +HPT that faded and also -HPT, she had vaginal delivery 03/06/15 and was on OCs but stopped them 2 weeks ago, was supposed to get Nexplanon 9/26, last sex 9/20.  Review of Systems Patient denies any headaches, hearing loss, fatigue, blurred vision, shortness of breath, chest pain, abdominal pain, problems with bowel movements, urination, or intercourse. No joint pain or mood swings.Missed period Reviewed past medical,surgical, social and family history. Reviewed medications and allergies.      Objective:   Physical Exam BP 120/76 mmHg  Pulse 104  Ht  (1.6 m)  Wt 154 lb (69.854 kg)  BMI 27.29 kg/m2  LMP 04/16/2015  Breastfeeding? No UPT negative, Skin warm and dry. Neck: mid line trachea, normal thyroid, good ROM, no lymphadenopathy noted. Lungs: clear to ausculation bilaterally. Cardiovascular: regular rate and rhythm.Will get [facility] now and reschedule nexplanon til 10/6, no sex til after insertion.    Assessment:     Missed period    Plan:     QHCG now No sex  Return 10/6 for stat [facility] in am, and nexplanon insertion in pm with Drenda Freeze, call before if period starts

## 2015-05-23 LAB — BETA HCG QUANT (REF LAB)

## 2015-05-25 ENCOUNTER — Telehealth: Payer: Self-pay | Admitting: *Deleted

## 2015-05-25 ENCOUNTER — Encounter: Payer: BC Managed Care – PPO | Admitting: Women's Health

## 2015-05-25 NOTE — Telephone Encounter (Signed)
Spoke with pt letting her know her quant was negative. Pt voiced understanding. JSY 

## 2015-06-01 ENCOUNTER — Telehealth: Payer: Self-pay | Admitting: Women's Health

## 2015-06-01 NOTE — Telephone Encounter (Signed)
Pt states she was to let Victorino Dike know she started her period 05/31/2015 and is getting the Mirena vs Nexplanon due to does not want to get pregnant prior to 5 years. All questions answered in regards to IUD insertion and side effects. Pt verbalized understanding and has an appt with Joellyn Haff on 06/03/2015 for Mirena insertion.

## 2015-06-01 NOTE — Telephone Encounter (Signed)
OK 

## 2015-06-03 ENCOUNTER — Encounter: Payer: Self-pay | Admitting: Women's Health

## 2015-06-03 ENCOUNTER — Ambulatory Visit (INDEPENDENT_AMBULATORY_CARE_PROVIDER_SITE_OTHER): Payer: BC Managed Care – PPO | Admitting: Women's Health

## 2015-06-03 ENCOUNTER — Other Ambulatory Visit: Payer: BC Managed Care – PPO

## 2015-06-03 VITALS — BP 112/60 | HR 80 | Wt 160.0 lb

## 2015-06-03 DIAGNOSIS — Z3040 Encounter for surveillance of contraceptives, unspecified: Secondary | ICD-10-CM

## 2015-06-03 DIAGNOSIS — Z30014 Encounter for initial prescription of intrauterine contraceptive device: Secondary | ICD-10-CM | POA: Diagnosis not present

## 2015-06-03 DIAGNOSIS — Z30430 Encounter for insertion of intrauterine contraceptive device: Secondary | ICD-10-CM

## 2015-06-03 LAB — BETA HCG QUANT (REF LAB): hCG Quant: <1 m[IU]/mL

## 2015-06-03 NOTE — Patient Instructions (Signed)
 Nothing in vagina for 3 days (no sex, douching, tampons, etc...)  Check your strings once a month to make sure you can feel them, if you are not able to please let us know  If you develop a fever of 100.4 or more in the next few weeks, or if you develop severe abdominal pain, please let us know  Use a backup method of birth control, such as condoms, for 2 weeks   Levonorgestrel intrauterine device (IUD) What is this medicine? LEVONORGESTREL IUD (LEE voe nor jes trel) is a contraceptive (birth control) device. The device is placed inside the uterus by a healthcare professional. It is used to prevent pregnancy and can also be used to treat heavy bleeding that occurs during your period. Depending on the device, it can be used for 3 to 5 years. This medicine may be used for other purposes; ask your health care provider or pharmacist if you have questions. What should I tell my health care provider before I take this medicine? They need to know if you have any of these conditions: -abnormal Pap smear -cancer of the breast, uterus, or cervix -diabetes -endometritis -genital or pelvic infection now or in the past -have more than one sexual partner or your partner has more than one partner -heart disease -history of an ectopic or tubal pregnancy -immune system problems -IUD in place -liver disease or tumor -problems with blood clots or take blood-thinners -use intravenous drugs -uterus of unusual shape -vaginal bleeding that has not been explained -an unusual or allergic reaction to levonorgestrel, other hormones, silicone, or polyethylene, medicines, foods, dyes, or preservatives -pregnant or trying to get pregnant -breast-feeding How should I use this medicine? This device is placed inside the uterus by a health care professional. Talk to your pediatrician regarding the use of this medicine in children. Special care may be needed. Overdosage: If you think you have taken too much of  this medicine contact a poison control center or emergency room at once. NOTE: This medicine is only for you. Do not share this medicine with others. What if I miss a dose? This does not apply. What may interact with this medicine? Do not take this medicine with any of the following medications: -amprenavir -bosentan -fosamprenavir This medicine may also interact with the following medications: -aprepitant -barbiturate medicines for inducing sleep or treating seizures -bexarotene -griseofulvin -medicines to treat seizures like carbamazepine, ethotoin, felbamate, oxcarbazepine, phenytoin, topiramate -modafinil -pioglitazone -rifabutin -rifampin -rifapentine -some medicines to treat HIV infection like atazanavir, indinavir, lopinavir, nelfinavir, tipranavir, ritonavir -St. John's wort -warfarin This list may not describe all possible interactions. Give your health care provider a list of all the medicines, herbs, non-prescription drugs, or dietary supplements you use. Also tell them if you smoke, drink alcohol, or use illegal drugs. Some items may interact with your medicine. What should I watch for while using this medicine? Visit your doctor or health care professional for regular check ups. See your doctor if you or your partner has sexual contact with others, becomes HIV positive, or gets a sexual transmitted disease. This product does not protect you against HIV infection (AIDS) or other sexually transmitted diseases. You can check the placement of the IUD yourself by reaching up to the top of your vagina with clean fingers to feel the threads. Do not pull on the threads. It is a good habit to check placement after each menstrual period. Call your doctor right away if you feel more of the IUD than just   the threads or if you cannot feel the threads at all. The IUD may come out by itself. You may become pregnant if the device comes out. If you notice that the IUD has come out use a  backup birth control method like condoms and call your health care provider. Using tampons will not change the position of the IUD and are okay to use during your period. What side effects may I notice from receiving this medicine? Side effects that you should report to your doctor or health care professional as soon as possible: -allergic reactions like skin rash, itching or hives, swelling of the face, lips, or tongue -fever, flu-like symptoms -genital sores -high blood pressure -no menstrual period for 6 weeks during use -pain, swelling, warmth in the leg -pelvic pain or tenderness -severe or sudden headache -signs of pregnancy -stomach cramping -sudden shortness of breath -trouble with balance, talking, or walking -unusual vaginal bleeding, discharge -yellowing of the eyes or skin Side effects that usually do not require medical attention (report to your doctor or health care professional if they continue or are bothersome): -acne -breast pain -change in sex drive or performance -changes in weight -cramping, dizziness, or faintness while the device is being inserted -headache -irregular menstrual bleeding within first 3 to 6 months of use -nausea This list may not describe all possible side effects. Call your doctor for medical advice about side effects. You may report side effects to FDA at 1-800-FDA-1088. Where should I keep my medicine? This does not apply. NOTE: This sheet is a summary. It may not cover all possible information. If you have questions about this medicine, talk to your doctor, pharmacist, or health care provider.    2016, Elsevier/Gold Standard. (2011-09-15 13:54:04)  

## 2015-06-03 NOTE — Progress Notes (Signed)
Patient ID: [name], female   DOB: [birth year], 19 y.o.   MRN: [id number] Kristina Robbins is a 19 y.o. year old G8P1021 Caucasian female who presents for placement of a Mirena IUD.  Patient's last menstrual period was 05/31/2015. BP 112/60 mmHg  Pulse 80  Wt 160 lb (72.576 kg)  LMP 05/31/2015 Last sexual intercourse was 3wks ago, and pregnancy test today was neg  The risks and benefits of the method and placement have been thouroughly reviewed with the patient and all questions were answered.  Specifically the patient is aware of failure rate of 08/998, expulsion of the IUD and of possible perforation.  The patient is aware of irregular bleeding due to the method and understands the incidence of irregular bleeding diminishes with time.  Signed copy of informed consent in chart.   Time out was performed.  A graves speculum was placed in the vagina.  The cervix was visualized, prepped using Betadine, and grasped with a single tooth tenaculum. The uterus was found to be anteroflexed and it sounded to 7 cm.  Graves IUD placed per manufacturer's recommendations.   The strings were trimmed to 3 cm.  Sonogram was performed and the proper placement of the IUD was verified via transvaginal u/s.   The patient was given post procedure instructions, including signs and symptoms of infection and to check for the strings after each menses or each month, and refraining from intercourse or anything in the vagina for 3 days.  She was given a Mirena care card with date IUD placed, and date IUD to be removed.  She is scheduled for a f/u appointment in 4 weeks.  Marge Duncans CNM, [facility] 06/03/2015 3:28 PM

## 2015-06-19 ENCOUNTER — Ambulatory Visit: Payer: BC Managed Care – PPO | Admitting: Adult Health

## 2015-07-01 ENCOUNTER — Ambulatory Visit: Payer: BC Managed Care – PPO | Admitting: Women's Health

## 2015-07-01 ENCOUNTER — Encounter: Payer: Self-pay | Admitting: *Deleted

## 2015-07-22 ENCOUNTER — Encounter: Payer: Self-pay | Admitting: *Deleted

## 2015-07-22 ENCOUNTER — Ambulatory Visit: Payer: BC Managed Care – PPO | Admitting: Women's Health

## 2015-08-19 ENCOUNTER — Telehealth: Payer: Self-pay | Admitting: *Deleted

## 2015-08-19 NOTE — Telephone Encounter (Signed)
Pt c/o severe cramping had IUD inserted on 06/03/2015. Pt states was seen yesterday at [facility] for the cramping and was told she had a "cervical infection" and would need to f/u with our office. Pt offered an appt for today. Pt states due to her work schedule she would not be able to come in for at least a week. Pt stated she would discuss with employer and would call our office back.

## 2015-08-30 NOTE — L&D Delivery Note (Signed)
Delivery Note At 7:33 PM a viable female was delivered via Vaginal, Spontaneous Delivery (Presentation:ROA ; Vertex  ).  APGAR: 8, 9; weight  pending.   Placenta status: delivered intact with gentle traction.  Cord: 3 vessel  with the following complications: nuchal x 1 .  Cord pH: not collected  Anesthesia:  epidural Episiotomy: None Lacerations: Periurethral Suture Repair: 3.0 vicryl Est. Blood Loss (mL): 150  Mom to postpartum.  Baby to Couplet care / Skin to Skin.  Ernestina Pennaicholas Hobson Lax 08/19/2016, 7:46 PM

## 2015-10-20 ENCOUNTER — Ambulatory Visit: Payer: BC Managed Care – PPO | Admitting: Women's Health

## 2015-10-20 ENCOUNTER — Encounter: Payer: Self-pay | Admitting: Women's Health

## 2015-10-26 ENCOUNTER — Telehealth: Payer: Self-pay | Admitting: *Deleted

## 2015-10-26 ENCOUNTER — Ambulatory Visit (INDEPENDENT_AMBULATORY_CARE_PROVIDER_SITE_OTHER): Payer: BC Managed Care – PPO | Admitting: Obstetrics and Gynecology

## 2015-10-26 ENCOUNTER — Encounter: Payer: Self-pay | Admitting: Obstetrics and Gynecology

## 2015-10-26 VITALS — BP 110/68 | Ht 63.0 in

## 2015-10-26 DIAGNOSIS — N946 Dysmenorrhea, unspecified: Secondary | ICD-10-CM

## 2015-10-26 DIAGNOSIS — Z30432 Encounter for removal of intrauterine contraceptive device: Secondary | ICD-10-CM | POA: Diagnosis not present

## 2015-10-26 DIAGNOSIS — F526 Dyspareunia not due to a substance or known physiological condition: Secondary | ICD-10-CM | POA: Diagnosis not present

## 2015-10-26 DIAGNOSIS — Z3202 Encounter for pregnancy test, result negative: Secondary | ICD-10-CM

## 2015-10-26 DIAGNOSIS — Z3200 Encounter for pregnancy test, result unknown: Secondary | ICD-10-CM

## 2015-10-26 LAB — POCT URINE PREGNANCY: Preg Test, Ur: NEGATIVE

## 2015-10-26 NOTE — Progress Notes (Signed)
[facility] Visit  Patient name: Kristina Robbins MRN [id number]  Date of birth: Sep 02, 1995  CC & HPI:  Kristina Robbins is a 20 y.o. female presenting today for 3 positive home pregnancy tests despite having a Mirena. She states she had also had a negative home pregnancy test following that, and the urine pregnancy test done in-office was negative. She reports her LMP occurred 2 weeks ago. She did have light spotting over the past week. Patient states she had dyspareunia since IUD placement. She states that she had an ultrasound done to confirm the position of the IUD when it was first placed. Patient also requests IUD removal, as she and her husband desire another baby.   ROS:  A complete 10 system review of systems was obtained and all systems are negative except as noted in the HPI and PMH.    Pertinent History Reviewed:   Reviewed: Significant for n/a Medical         Past Medical History  Diagnosis Date  . Irritable bowel syndrome   . Cystic fibroadenosis of breast   . Missed period 05/22/2015                              Surgical Hx:    Past Surgical History  Procedure Laterality Date  . No past surgeries     Medications: Reviewed & Updated - see associated section                       Current outpatient prescriptions:  .  acetaminophen (TYLENOL) 500 MG tablet, Take 500 mg by mouth every 6 (six) hours as needed for mild pain. , Disp: , Rfl:  .  Ca Carbonate-Mag Hydroxide (ROLAIDS PO), Take 1-2 tablets by mouth as needed (heartburn)., Disp: , Rfl:  .  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  ranitidine (ZANTAC) 150 MG tablet, Take 150 mg by mouth 2 (two) times Robbins., Disp: , Rfl:    Social History: Reviewed -  reports that she has been smoking Cigarettes.  She has a .5 pack-year smoking history. She has never used smokeless tobacco.  Objective Findings:  Vitals: Blood pressure 110/68,  height  (1.6 m), not currently breastfeeding.  Physical Examination: General appearance - alert, well appearing, and in no distress and oriented to person, place, and time Mental status - alert, oriented to person, place, and time, normal mood, behavior, speech, dress, motor activity, and thought processes, affect appropriate to mood Pelvic - normal external genitalia, vulva, vagina, cervix, uterus and adnexa,  VULVA: normal appearing vulva with no masses, tenderness or lesions,  VAGINA: normal appearing vagina with normal color and discharge, no lesions,  CERVIX: normal appearing cervix without discharge or lesions,  UTERUS: uterus is normal size, shape, consistency and nontender  [facility] PROCEDURE NOTE  Kristina Robbins is a 20 y.o. [phone] here for Mirena IUD removal. No GYN concerns.  IUD Removal  Patient was in the dorsal lithotomy position, normal external genitalia was noted.  A speculum was placed in the patient's vagina, normal discharge was noted, no lesions. The multiparous cervix was visualized, no lesions, no abnormal discharge;  and the cervix was swabbed with Betadine using scopettes. The strings of the IUD were grasped and pulled using ring forceps.  The IUD was successfully removed in  its entirety.   Patient tolerated the procedure well.      Assessment & Plan:   A:  1. Dyspareunia, dysmenorrhea with IUD. IUD removed without difficulty.  2. Desires pregnancy  P: 1. Barrier contraception x 2 weeks. 2. May resume efforts at conception in 2 weeks.     By signing my name below, I, Ronney Lion, attest that this documentation has been prepared under the direction and in the presence of Tilda Burrow, MD. Electronically Signed: Ronney Lion, ED Scribe. 10/26/2015. 2:32 PM.  I personally performed the services described in this documentation, which was SCRIBED in my presence. The recorded information has been reviewed and considered accurate. It  has been edited as necessary during review. Tilda Burrow, MD

## 2015-10-26 NOTE — Telephone Encounter (Signed)
Pt given an appt for evaluation of pregnancy and IUD today with Dr. Emelda Fear.

## 2015-10-26 NOTE — Progress Notes (Signed)
Patient ID: Kristina Robbins, female   DOB: 08/31/1995, 20 y.o.   MRN: [id number] Pt here today for IUD removal. Pt states that she has had 3 positive pregnancy tests and one negative one.

## 2015-12-14 ENCOUNTER — Encounter: Payer: Self-pay | Admitting: Adult Health

## 2015-12-14 ENCOUNTER — Ambulatory Visit (INDEPENDENT_AMBULATORY_CARE_PROVIDER_SITE_OTHER): Payer: BC Managed Care – PPO | Admitting: Adult Health

## 2015-12-14 VITALS — BP 120/60 | HR 100 | Ht 63.0 in | Wt 165.0 lb

## 2015-12-14 DIAGNOSIS — R252 Cramp and spasm: Secondary | ICD-10-CM | POA: Diagnosis not present

## 2015-12-14 DIAGNOSIS — R109 Unspecified abdominal pain: Secondary | ICD-10-CM | POA: Diagnosis not present

## 2015-12-14 DIAGNOSIS — O3680X0 Pregnancy with inconclusive fetal viability, not applicable or unspecified: Secondary | ICD-10-CM

## 2015-12-14 DIAGNOSIS — R110 Nausea: Secondary | ICD-10-CM

## 2015-12-14 DIAGNOSIS — Z3201 Encounter for pregnancy test, result positive: Secondary | ICD-10-CM | POA: Diagnosis not present

## 2015-12-14 DIAGNOSIS — N926 Irregular menstruation, unspecified: Secondary | ICD-10-CM

## 2015-12-14 DIAGNOSIS — Z3490 Encounter for supervision of normal pregnancy, unspecified, unspecified trimester: Secondary | ICD-10-CM

## 2015-12-14 DIAGNOSIS — O26899 Other specified pregnancy related conditions, unspecified trimester: Secondary | ICD-10-CM

## 2015-12-14 HISTORY — DX: Encounter for supervision of normal pregnancy, unspecified, unspecified trimester: Z34.90

## 2015-12-14 LAB — POCT URINE PREGNANCY: PREG TEST UR: POSITIVE — AB

## 2015-12-14 MED ORDER — FLINTSTONES COMPLETE 60 MG PO CHEW: CHEWABLE_TABLET | ORAL | Status: AC

## 2015-12-14 NOTE — Progress Notes (Signed)
Subjective:     Patient ID: Kristina MarlinBrittany Hiatt Robbins, female   DOB: Dec 28, 1995, 20 y.o.   MRN: [id number]  HPI Kristina Robbins is a 20 year old white female in for UPT has missed a period and has had cramping for about 5 days,about time period due.Denies any bleeding, some nausea.Some of the cramps have been sharp and stabbing, none right now. Blood type A+. She has IUD removed 10/26/15.  Review of Systems Patient denies any headaches, hearing loss, fatigue, blurred vision, shortness of breath, chest pain,  problems with bowel movements, urination, or intercourse. No joint pain or mood swings. See HPI for positives. Reviewed past medical,surgical, social and family history. Reviewed medications and allergies.     Objective:   Physical Exam BP 120/60 mmHg  Pulse 100  Ht 5\' 3"  (1.6 m)  Wt 165 lb (74.844 kg)  BMI 29.24 kg/m2  LMP 11/11/2015  Breastfeeding? No UPT +, about 4+5 weeks by LMP with EDD 08/17/16, Skin warm and dry. Neck: mid line trachea, normal thyroid, good ROM, no lymphadenopathy noted. Lungs: clear to ausculation bilaterally. Cardiovascular: regular rate and rhythm.Abdomen soft and non tender   Will get progesterone and QHCG today and schedule US for next week.  Form given to apply for medicaid.  Assessment:     UPT + Pregnant Cramping     Plan:     Check QHCG and progesterone today Take flinstones 2 daily Return in 1 week for US Increase fluids Review handout on first trimester

## 2015-12-14 NOTE — Patient Instructions (Signed)
Increase fluids Return in 1 week for US First Trimester of Pregnancy The first trimester of pregnancy is from week 1 until the end of week 12 (months 1 through 3). A week after a sperm fertilizes an egg, the egg will implant on the wall of the uterus. This embryo will begin to develop into a baby. Genes from you and your partner are forming the baby. The female genes determine whether the baby is a boy or a girl. At 6-8 weeks, the eyes and face are formed, and the heartbeat can be seen on ultrasound. At the end of 12 weeks, all the baby's organs are formed.  Now that you are pregnant, you will want to do everything you can to have a healthy baby. Two of the most important things are to get good prenatal care and to follow your health care provider's instructions. Prenatal care is all the medical care you receive before the baby's birth. This care will help prevent, find, and treat any problems during the pregnancy and childbirth. BODY CHANGES Your body goes through many changes during pregnancy. The changes vary from woman to woman.   You may gain or lose a couple of pounds at first.  You may feel sick to your stomach (nauseous) and throw up (vomit). If the vomiting is uncontrollable, call your health care provider.  You may tire easily.  You may develop headaches that can be relieved by medicines approved by your health care provider.  You may urinate more often. Painful urination may mean you have a bladder infection.  You may develop heartburn as a result of your pregnancy.  You may develop constipation because certain hormones are causing the muscles that push waste through your intestines to slow down.  You may develop hemorrhoids or swollen, bulging veins (varicose veins).  Your breasts may begin to grow larger and become tender. Your nipples may stick out more, and the tissue that surrounds them (areola) may become darker.  Your gums may bleed and may be sensitive to brushing and  flossing.  Dark spots or blotches (chloasma, mask of pregnancy) may develop on your face. This will likely fade after the baby is born.  Your menstrual periods will stop.  You may have a loss of appetite.  You may develop cravings for certain kinds of food.  You may have changes in your emotions from day to day, such as being excited to be pregnant or being concerned that something may go wrong with the pregnancy and baby.  You may have more vivid and strange dreams.  You may have changes in your hair. These can include thickening of your hair, rapid growth, and changes in texture. Some women also have hair loss during or after pregnancy, or hair that feels dry or thin. Your hair will most likely return to normal after your baby is born. WHAT TO EXPECT AT YOUR PRENATAL VISITS During a routine prenatal visit:  You will be weighed to make sure you and the baby are growing normally.  Your blood pressure will be taken.  Your abdomen will be measured to track your baby's growth.  The fetal heartbeat will be listened to starting around week 10 or 12 of your pregnancy.  Test results from any previous visits will be discussed. Your health care provider may ask you:  How you are feeling.  If you are feeling the baby move.  If you have had any abnormal symptoms, such as leaking fluid, bleeding, severe headaches, or abdominal cramping.  If you are using any tobacco products, including cigarettes, chewing tobacco, and electronic cigarettes.  If you have any questions. Other tests that may be performed during your first trimester include:  Blood tests to find your blood type and to check for the presence of any previous infections. They will also be used to check for low iron levels (anemia) and Rh antibodies. Later in the pregnancy, blood tests for diabetes will be done along with other tests if problems develop.  Urine tests to check for infections, diabetes, or protein in the  urine.  An ultrasound to confirm the proper growth and development of the baby.  An amniocentesis to check for possible genetic problems.  Fetal screens for spina bifida and Down syndrome.  You may need other tests to make sure you and the baby are doing well.  HIV (human immunodeficiency virus) testing. Routine prenatal testing includes screening for HIV, unless you choose not to have this test. HOME CARE INSTRUCTIONS  Medicines  Follow your health care provider's instructions regarding medicine use. Specific medicines may be either safe or unsafe to take during pregnancy.  Take your prenatal vitamins as directed.  If you develop constipation, try taking a stool softener if your health care provider approves. Diet  Eat regular, well-balanced meals. Choose a variety of foods, such as meat or vegetable-based protein, fish, milk and low-fat dairy products, vegetables, fruits, and whole grain breads and cereals. Your health care provider will help you determine the amount of weight gain that is right for you.  Avoid raw meat and uncooked cheese. These carry germs that can cause birth defects in the baby.  Eating four or five small meals rather than three large meals a day may help relieve nausea and vomiting. If you start to feel nauseous, eating a few soda crackers can be helpful. Drinking liquids between meals instead of during meals also seems to help nausea and vomiting.  If you develop constipation, eat more high-fiber foods, such as fresh vegetables or fruit and whole grains. Drink enough fluids to keep your urine clear or pale yellow. Activity and Exercise  Exercise only as directed by your health care provider. Exercising will help you:  Control your weight.  Stay in shape.  Be prepared for labor and delivery.  Experiencing pain or cramping in the lower abdomen or low back is a good sign that you should stop exercising. Check with your health care provider before continuing  normal exercises.  Try to avoid standing for long periods of time. Move your legs often if you must stand in one place for a long time.  Avoid heavy lifting.  Wear low-heeled shoes, and practice good posture.  You may continue to have sex unless your health care provider directs you otherwise. Relief of Pain or Discomfort  Wear a good support bra for breast tenderness.   Take warm sitz baths to soothe any pain or discomfort caused by hemorrhoids. Use hemorrhoid cream if your health care provider approves.   Rest with your legs elevated if you have leg cramps or low back pain.  If you develop varicose veins in your legs, wear support hose. Elevate your feet for 15 minutes, 3-4 times a day. Limit salt in your diet. Prenatal Care  Schedule your prenatal visits by the twelfth week of pregnancy. They are usually scheduled monthly at first, then more often in the last 2 months before delivery.  Write down your questions. Take them to your prenatal visits.  Keep all  your prenatal visits as directed by your health care provider. Safety  Wear your seat belt at all times when driving.  Make a list of emergency phone numbers, including numbers for family, friends, the hospital, and police and fire departments. General Tips  Ask your health care provider for a referral to a local prenatal education class. Begin classes no later than at the beginning of month 6 of your pregnancy.  Ask for help if you have counseling or nutritional needs during pregnancy. Your health care provider can offer advice or refer you to specialists for help with various needs.  Do not use hot tubs, steam rooms, or saunas.  Do not douche or use tampons or scented sanitary pads.  Do not cross your legs for long periods of time.  Avoid cat litter boxes and soil used by cats. These carry germs that can cause birth defects in the baby and possibly loss of the fetus by miscarriage or stillbirth.  Avoid all  smoking, herbs, alcohol, and medicines not prescribed by your health care provider. Chemicals in these affect the formation and growth of the baby.  Do not use any tobacco products, including cigarettes, chewing tobacco, and electronic cigarettes. If you need help quitting, ask your health care provider. You may receive counseling support and other resources to help you quit.  Schedule a dentist appointment. At home, brush your teeth with a soft toothbrush and be gentle when you floss. SEEK MEDICAL CARE IF:   You have dizziness.  You have mild pelvic cramps, pelvic pressure, or nagging pain in the abdominal area.  You have persistent nausea, vomiting, or diarrhea.  You have a bad smelling vaginal discharge.  You have pain with urination.  You notice increased swelling in your face, hands, legs, or ankles. SEEK IMMEDIATE MEDICAL CARE IF:   You have a fever.  You are leaking fluid from your vagina.  You have spotting or bleeding from your vagina.  You have severe abdominal cramping or pain.  You have rapid weight gain or loss.  You vomit blood or material that looks like coffee grounds.  You are exposed to [place] measles and have never had them.  You are exposed to fifth disease or chickenpox.  You develop a severe headache.  You have shortness of breath.  You have any kind of trauma, such as from a fall or a car accident.   This information is not intended to replace advice given to you by your health care provider. Make sure you discuss any questions you have with your health care provider.   Document Released: 08/09/2001 Document Revised: 09/05/2014 Document Reviewed: 06/25/2013 Elsevier Interactive Patient Education [employer].

## 2015-12-15 ENCOUNTER — Telehealth: Payer: Self-pay | Admitting: Adult Health

## 2015-12-15 LAB — PROGESTERONE: Progesterone: 12.9 ng/mL

## 2015-12-15 LAB — BETA HCG QUANT (REF LAB): hCG Quant: 388 m[IU]/mL

## 2015-12-15 NOTE — Telephone Encounter (Signed)
Pt aware of labs, no bleeding some cramps, keep appt next week, declines more labs

## 2015-12-15 NOTE — Telephone Encounter (Signed)
No voice mail box.

## 2015-12-21 ENCOUNTER — Other Ambulatory Visit: Payer: Self-pay | Admitting: Obstetrics and Gynecology

## 2015-12-21 ENCOUNTER — Ambulatory Visit (INDEPENDENT_AMBULATORY_CARE_PROVIDER_SITE_OTHER): Payer: BC Managed Care – PPO

## 2015-12-21 DIAGNOSIS — O3680X0 Pregnancy with inconclusive fetal viability, not applicable or unspecified: Secondary | ICD-10-CM

## 2015-12-21 NOTE — Progress Notes (Signed)
US 5+5 wks GS w/ys,no fetal pole seen,normal ov's bilat,pt will come back in 10 days for f/u ultrasound

## 2015-12-28 ENCOUNTER — Ambulatory Visit (INDEPENDENT_AMBULATORY_CARE_PROVIDER_SITE_OTHER): Payer: BC Managed Care – PPO

## 2015-12-28 DIAGNOSIS — O3680X0 Pregnancy with inconclusive fetal viability, not applicable or unspecified: Secondary | ICD-10-CM | POA: Diagnosis not present

## 2015-12-28 DIAGNOSIS — Z3A01 Less than 8 weeks gestation of pregnancy: Secondary | ICD-10-CM | POA: Diagnosis not present

## 2015-12-28 NOTE — Progress Notes (Signed)
US 5+5wks,single IUP w/ys,normal ov's bilat,crl 3.2 mm,fhr 117 bpm

## 2016-01-05 ENCOUNTER — Encounter: Payer: BC Managed Care – PPO | Admitting: Women's Health

## 2016-01-05 ENCOUNTER — Encounter: Payer: Self-pay | Admitting: *Deleted

## 2016-02-02 ENCOUNTER — Encounter: Payer: Self-pay | Admitting: Women's Health

## 2016-02-02 ENCOUNTER — Ambulatory Visit (INDEPENDENT_AMBULATORY_CARE_PROVIDER_SITE_OTHER): Payer: Medicaid Other | Admitting: Women's Health

## 2016-02-02 VITALS — BP 112/60 | HR 88 | Wt 162.0 lb

## 2016-02-02 DIAGNOSIS — Z3481 Encounter for supervision of other normal pregnancy, first trimester: Secondary | ICD-10-CM

## 2016-02-02 DIAGNOSIS — Z0283 Encounter for blood-alcohol and blood-drug test: Secondary | ICD-10-CM

## 2016-02-02 DIAGNOSIS — Z1389 Encounter for screening for other disorder: Secondary | ICD-10-CM

## 2016-02-02 DIAGNOSIS — Z3A11 11 weeks gestation of pregnancy: Secondary | ICD-10-CM

## 2016-02-02 DIAGNOSIS — Z331 Pregnant state, incidental: Secondary | ICD-10-CM

## 2016-02-02 DIAGNOSIS — Z369 Encounter for antenatal screening, unspecified: Secondary | ICD-10-CM

## 2016-02-02 DIAGNOSIS — Z3491 Encounter for supervision of normal pregnancy, unspecified, first trimester: Secondary | ICD-10-CM

## 2016-02-02 DIAGNOSIS — Z3490 Encounter for supervision of normal pregnancy, unspecified, unspecified trimester: Secondary | ICD-10-CM | POA: Insufficient documentation

## 2016-02-02 DIAGNOSIS — Z3682 Encounter for antenatal screening for nuchal translucency: Secondary | ICD-10-CM

## 2016-02-02 NOTE — Patient Instructions (Signed)
Nausea & Vomiting  Have saltine crackers or pretzels by your bed and eat a few bites before you raise your head out of bed in the morning  Eat small frequent meals throughout the day instead of large meals  Drink plenty of fluids throughout the day to stay hydrated, just don't drink a lot of fluids with your meals.  This can make your stomach fill up faster making you feel sick  Do not brush your teeth right after you eat  Products with real ginger are good for nausea, like ginger ale and ginger hard candy Make sure it says made with real ginger!  Sucking on sour candy like lemon heads is also good for nausea  If your prenatal vitamins make you nauseated, take them at night so you will sleep through the nausea  Sea Bands  If you feel like you need medicine for the nausea & vomiting please let us know  If you are unable to keep any fluids or food down please let us know   Constipation  Drink plenty of fluid, preferably water, throughout the day  Eat foods high in fiber such as fruits, vegetables, and grains  Exercise, such as walking, is a good way to keep your bowels regular  Drink warm fluids, especially warm prune juice, or decaf coffee  Eat a 1/2 cup of real oatmeal (not instant), 1/2 cup applesauce, and 1/2-1 cup warm prune juice every day  If needed, you may take Colace (docusate sodium) stool softener once or twice a day to help keep the stool soft. If you are pregnant, wait until you are out of your first trimester (12-14 weeks of pregnancy)  If you still are having problems with constipation, you may take Miralax once daily as needed to help keep your bowels regular.  If you are pregnant, wait until you are out of your first trimester (12-14 weeks of pregnancy)   First Trimester of Pregnancy The first trimester of pregnancy is from week 1 until the end of week 12 (months 1 through 3). A week after a sperm fertilizes an egg, the egg will implant on the wall of the  uterus. This embryo will begin to develop into a baby. Genes from you and your partner are forming the baby. The female genes determine whether the baby is a boy or a girl. At 6-8 weeks, the eyes and face are formed, and the heartbeat can be seen on ultrasound. At the end of 12 weeks, all the baby's organs are formed.  Now that you are pregnant, you will want to do everything you can to have a healthy baby. Two of the most important things are to get good prenatal care and to follow your health care provider's instructions. Prenatal care is all the medical care you receive before the baby's birth. This care will help prevent, find, and treat any problems during the pregnancy and childbirth. BODY CHANGES Your body goes through many changes during pregnancy. The changes vary from woman to woman.   You may gain or lose a couple of pounds at first.  You may feel sick to your stomach (nauseous) and throw up (vomit). If the vomiting is uncontrollable, call your health care provider.  You may tire easily.  You may develop headaches that can be relieved by medicines approved by your health care provider.  You may urinate more often. Painful urination may mean you have a bladder infection.  You may develop heartburn as a result of your pregnancy.    You may develop constipation because certain hormones are causing the muscles that push waste through your intestines to slow down.  You may develop hemorrhoids or swollen, bulging veins (varicose veins).  Your breasts may begin to grow larger and become tender. Your nipples may stick out more, and the tissue that surrounds them (areola) may become darker.  Your gums may bleed and may be sensitive to brushing and flossing.  Dark spots or blotches (chloasma, mask of pregnancy) may develop on your face. This will likely fade after the baby is born.  Your menstrual periods will stop.  You may have a loss of appetite.  You may develop cravings for certain  kinds of food.  You may have changes in your emotions from day to day, such as being excited to be pregnant or being concerned that something may go wrong with the pregnancy and baby.  You may have more vivid and strange dreams.  You may have changes in your hair. These can include thickening of your hair, rapid growth, and changes in texture. Some women also have hair loss during or after pregnancy, or hair that feels dry or thin. Your hair will most likely return to normal after your baby is born. WHAT TO EXPECT AT YOUR PRENATAL VISITS During a routine prenatal visit:  You will be weighed to make sure you and the baby are growing normally.  Your blood pressure will be taken.  Your abdomen will be measured to track your baby's growth.  The fetal heartbeat will be listened to starting around week 10 or 12 of your pregnancy.  Test results from any previous visits will be discussed. Your health care provider may ask you:  How you are feeling.  If you are feeling the baby move.  If you have had any abnormal symptoms, such as leaking fluid, bleeding, severe headaches, or abdominal cramping.  If you have any questions. Other tests that may be performed during your first trimester include:  Blood tests to find your blood type and to check for the presence of any previous infections. They will also be used to check for low iron levels (anemia) and Rh antibodies. Later in the pregnancy, blood tests for diabetes will be done along with other tests if problems develop.  Urine tests to check for infections, diabetes, or protein in the urine.  An ultrasound to confirm the proper growth and development of the baby.  An amniocentesis to check for possible genetic problems.  Fetal screens for spina bifida and Down syndrome.  You may need other tests to make sure you and the baby are doing well. HOME CARE INSTRUCTIONS  Medicines  Follow your health care provider's instructions regarding  medicine use. Specific medicines may be either safe or unsafe to take during pregnancy.  Take your prenatal vitamins as directed.  If you develop constipation, try taking a stool softener if your health care provider approves. Diet  Eat regular, well-balanced meals. Choose a variety of foods, such as meat or vegetable-based protein, fish, milk and low-fat dairy products, vegetables, fruits, and whole grain breads and cereals. Your health care provider will help you determine the amount of weight gain that is right for you.  Avoid raw meat and uncooked cheese. These carry germs that can cause birth defects in the baby.  Eating four or five small meals rather than three large meals a day may help relieve nausea and vomiting. If you start to feel nauseous, eating a few soda crackers can be   helpful. Drinking liquids between meals instead of during meals also seems to help nausea and vomiting.  If you develop constipation, eat more high-fiber foods, such as fresh vegetables or fruit and whole grains. Drink enough fluids to keep your urine clear or pale yellow. Activity and Exercise  Exercise only as directed by your health care provider. Exercising will help you:  Control your weight.  Stay in shape.  Be prepared for labor and delivery.  Experiencing pain or cramping in the lower abdomen or low back is a good sign that you should stop exercising. Check with your health care provider before continuing normal exercises.  Try to avoid standing for long periods of time. Move your legs often if you must stand in one place for a long time.  Avoid heavy lifting.  Wear low-heeled shoes, and practice good posture.  You may continue to have sex unless your health care provider directs you otherwise. Relief of Pain or Discomfort  Wear a good support bra for breast tenderness.   Take warm sitz baths to soothe any pain or discomfort caused by hemorrhoids. Use hemorrhoid cream if your health care  provider approves.   Rest with your legs elevated if you have leg cramps or low back pain.  If you develop varicose veins in your legs, wear support hose. Elevate your feet for 15 minutes, 3-4 times a day. Limit salt in your diet. Prenatal Care  Schedule your prenatal visits by the twelfth week of pregnancy. They are usually scheduled monthly at first, then more often in the last 2 months before delivery.  Write down your questions. Take them to your prenatal visits.  Keep all your prenatal visits as directed by your health care provider. Safety  Wear your seat belt at all times when driving.  Make a list of emergency phone numbers, including numbers for family, friends, the hospital, and police and fire departments. General Tips  Ask your health care provider for a referral to a local prenatal education class. Begin classes no later than at the beginning of month 6 of your pregnancy.  Ask for help if you have counseling or nutritional needs during pregnancy. Your health care provider can offer advice or refer you to specialists for help with various needs.  Do not use hot tubs, steam rooms, or saunas.  Do not douche or use tampons or scented sanitary pads.  Do not cross your legs for long periods of time.  Avoid cat litter boxes and soil used by cats. These carry germs that can cause birth defects in the baby and possibly loss of the fetus by miscarriage or stillbirth.  Avoid all smoking, herbs, alcohol, and medicines not prescribed by your health care provider. Chemicals in these affect the formation and growth of the baby.  Schedule a dentist appointment. At home, brush your teeth with a soft toothbrush and be gentle when you floss. SEEK MEDICAL CARE IF:   You have dizziness.  You have mild pelvic cramps, pelvic pressure, or nagging pain in the abdominal area.  You have persistent nausea, vomiting, or diarrhea.  You have a bad smelling vaginal discharge.  You have pain  with urination.  You notice increased swelling in your face, hands, legs, or ankles. SEEK IMMEDIATE MEDICAL CARE IF:   You have a fever.  You are leaking fluid from your vagina.  You have spotting or bleeding from your vagina.  You have severe abdominal cramping or pain.  You have rapid weight gain or loss.    You vomit blood or material that looks like coffee grounds.  You are exposed to German measles and have never had them.  You are exposed to fifth disease or chickenpox.  You develop a severe headache.  You have shortness of breath.  You have any kind of trauma, such as from a fall or a car accident. Document Released: 08/09/2001 Document Revised: 12/30/2013 Document Reviewed: 06/25/2013 [employer] Patient Information 2015 [employer]. This information is not intended to replace advice given to you by your health care provider. Make sure you discuss any questions you have with your health care provider.   Sciatica With Rehab The sciatic nerve runs from the back down the leg and is responsible for sensation and control of the muscles in the back (posterior) side of the thigh, lower leg, and foot. Sciatica is a condition that is characterized by inflammation of this nerve.  SYMPTOMS   Signs of nerve damage, including numbness and/or weakness along the posterior side of the lower extremity.  Pain in the back of the thigh that may also travel down the leg.  Pain that worsens when sitting for long periods of time.  Occasionally, pain in the back or buttock. CAUSES  Inflammation of the sciatic nerve is the cause of sciatica. The inflammation is due to something irritating the nerve. Common sources of irritation include:  Sitting for long periods of time.  Direct trauma to the nerve.  Arthritis of the spine.  Herniated or ruptured disk.  Slipping of the vertebrae (spondylolisthesis).  Pressure from soft tissues, such as muscles or ligament-like tissue  (fascia). RISK INCREASES WITH:  Sports that place pressure or stress on the spine (football or weightlifting).  Poor strength and flexibility.  Failure to warm up properly before activity.  Family history of low back pain or disk disorders.  Previous back injury or surgery.  Poor body mechanics, especially when lifting, or poor posture. PREVENTION   Warm up and stretch properly before activity.  Maintain physical fitness:  Strength, flexibility, and endurance.  Cardiovascular fitness.  Learn and use proper technique, especially with posture and lifting. When possible, have coach correct improper technique.  Avoid activities that place stress on the spine. PROGNOSIS If treated properly, then sciatica usually resolves within 6 weeks. However, occasionally surgery is necessary.  RELATED COMPLICATIONS   Permanent nerve damage, including pain, numbness, tingle, or weakness.  Chronic back pain.  Risks of surgery: infection, bleeding, nerve damage, or damage to surrounding tissues. TREATMENT Treatment initially involves resting from any activities that aggravate your symptoms. The use of ice and medication may help reduce pain and inflammation. The use of strengthening and stretching exercises may help reduce pain with activity. These exercises may be performed at home or with referral to a therapist. A therapist may recommend further treatments, such as transcutaneous electronic nerve stimulation (TENS) or ultrasound. Your caregiver may recommend corticosteroid injections to help reduce inflammation of the sciatic nerve. If symptoms persist despite non-surgical (conservative) treatment, then surgery may be recommended. MEDICATION  If pain medication is necessary, then nonsteroidal anti-inflammatory medications, such as aspirin and ibuprofen, or other minor pain relievers, such as acetaminophen, are often recommended.  Do not take pain medication for 7 days before  surgery.  Prescription pain relievers may be given if deemed necessary by your caregiver. Use only as directed and only as much as you need.  Ointments applied to the skin may be helpful.  Corticosteroid injections may be given by your caregiver. These injections should   be reserved for the most serious cases, because they may only be given a certain number of times. HEAT AND COLD  Cold treatment (icing) relieves pain and reduces inflammation. Cold treatment should be applied for 10 to 15 minutes every 2 to 3 hours for inflammation and pain and immediately after any activity that aggravates your symptoms. Use ice packs or massage the area with a piece of ice (ice massage).  Heat treatment may be used prior to performing the stretching and strengthening activities prescribed by your caregiver, physical therapist, or athletic trainer. Use a heat pack or soak the injury in warm water. SEEK MEDICAL CARE IF:  Treatment seems to offer no benefit, or the condition worsens.  Any medications produce adverse side effects. EXERCISES  RANGE OF MOTION (ROM) AND STRETCHING EXERCISES - Sciatica Most people with sciatic will find that their symptoms worsen with either excessive bending forward (flexion) or arching at the low back (extension). The exercises which will help resolve your symptoms will focus on the opposite motion. Your physician, physical therapist or athletic trainer will help you determine which exercises will be most helpful to resolve your low back pain. Do not complete any exercises without first consulting with your clinician. Discontinue any exercises which worsen your symptoms until you speak to your clinician. If you have pain, numbness or tingling which travels down into your buttocks, leg or foot, the goal of the therapy is for these symptoms to move closer to your back and eventually resolve. Occasionally, these leg symptoms will get better, but your low back pain may worsen; this is  typically an indication of progress in your rehabilitation. Be certain to be very alert to any changes in your symptoms and the activities in which you participated in the 24 hours prior to the change. Sharing this information with your clinician will allow him/her to most efficiently treat your condition. These exercises may help you when beginning to rehabilitate your injury. Your symptoms may resolve with or without further involvement from your physician, physical therapist or athletic trainer. While completing these exercises, remember:   Restoring tissue flexibility helps normal motion to return to the joints. This allows healthier, less painful movement and activity.  An effective stretch should be held for at least 30 seconds.  A stretch should never be painful. You should only feel a gentle lengthening or release in the stretched tissue. FLEXION RANGE OF MOTION AND STRETCHING EXERCISES: STRETCH - Flexion, Single Knee to Chest   Lie on a firm bed or floor with both legs extended in front of you.  Keeping one leg in contact with the floor, bring your opposite knee to your chest. Hold your leg in place by either grabbing behind your thigh or at your knee.  Pull until you feel a gentle stretch in your low back. Hold __________ seconds.  Slowly release your grasp and repeat the exercise with the opposite side. Repeat __________ times. Complete this exercise __________ times per day.  STRETCH - Flexion, Double Knee to Chest  Lie on a firm bed or floor with both legs extended in front of you.  Keeping one leg in contact with the floor, bring your opposite knee to your chest.  Tense your stomach muscles to support your back and then lift your other knee to your chest. Hold your legs in place by either grabbing behind your thighs or at your knees.  Pull both knees toward your chest until you feel a gentle stretch in your low back.   Hold __________ seconds.  Tense your stomach muscles and  slowly return one leg at a time to the floor. Repeat __________ times. Complete this exercise __________ times per day.  STRETCH - Low Trunk Rotation   Lie on a firm bed or floor. Keeping your legs in front of you, bend your knees so they are both pointed toward the ceiling and your feet are flat on the floor.  Extend your arms out to the side. This will stabilize your upper body by keeping your shoulders in contact with the floor.  Gently and slowly drop both knees together to one side until you feel a gentle stretch in your low back. Hold for __________ seconds.  Tense your stomach muscles to support your low back as you bring your knees back to the starting position. Repeat the exercise to the other side. Repeat __________ times. Complete this exercise __________ times per day  EXTENSION RANGE OF MOTION AND FLEXIBILITY EXERCISES: STRETCH - Extension, Prone on Elbows  Lie on your stomach on the floor, a bed will be too soft. Place your palms about shoulder width apart and at the height of your head.  Place your elbows under your shoulders. If this is too painful, stack pillows under your chest.  Allow your body to relax so that your hips drop lower and make contact more completely with the floor.  Hold this position for __________ seconds.  Slowly return to lying flat on the floor. Repeat __________ times. Complete this exercise __________ times per day.  RANGE OF MOTION - Extension, Prone Press Ups  Lie on your stomach on the floor, a bed will be too soft. Place your palms about shoulder width apart and at the height of your head.  Keeping your back as relaxed as possible, slowly straighten your elbows while keeping your hips on the floor. You may adjust the placement of your hands to maximize your comfort. As you gain motion, your hands will come more underneath your shoulders.  Hold this position __________ seconds.  Slowly return to lying flat on the floor. Repeat __________  times. Complete this exercise __________ times per day.  STRENGTHENING EXERCISES - Sciatica  These exercises may help you when beginning to rehabilitate your injury. These exercises should be done near your "sweet spot." This is the neutral, low-back arch, somewhere between fully rounded and fully arched, that is your least painful position. When performed in this safe range of motion, these exercises can be used for people who have either a flexion or extension based injury. These exercises may resolve your symptoms with or without further involvement from your physician, physical therapist or athletic trainer. While completing these exercises, remember:   Muscles can gain both the endurance and the strength needed for everyday activities through controlled exercises.  Complete these exercises as instructed by your physician, physical therapist or athletic trainer. Progress with the resistance and repetition exercises only as your caregiver advises.  You may experience muscle soreness or fatigue, but the pain or discomfort you are trying to eliminate should never worsen during these exercises. If this pain does worsen, stop and make certain you are following the directions exactly. If the pain is still present after adjustments, discontinue the exercise until you can discuss the trouble with your clinician. STRENGTHENING - Deep Abdominals, Pelvic Tilt   Lie on a firm bed or floor. Keeping your legs in front of you, bend your knees so they are both pointed toward the ceiling and your feet are   flat on the floor.  Tense your lower abdominal muscles to press your low back into the floor. This motion will rotate your pelvis so that your tail bone is scooping upwards rather than pointing at your feet or into the floor.  With a gentle tension and even breathing, hold this position for __________ seconds. Repeat __________ times. Complete this exercise __________ times per day.  STRENGTHENING - Abdominals,  Crunches   Lie on a firm bed or floor. Keeping your legs in front of you, bend your knees so they are both pointed toward the ceiling and your feet are flat on the floor. Cross your arms over your chest.  Slightly tip your chin down without bending your neck.  Tense your abdominals and slowly lift your trunk high enough to just clear your shoulder blades. Lifting higher can put excessive stress on the low back and does not further strengthen your abdominal muscles.  Control your return to the starting position. Repeat __________ times. Complete this exercise __________ times per day.  STRENGTHENING - Quadruped, Opposite UE/LE Lift  Assume a hands and knees position on a firm surface. Keep your hands under your shoulders and your knees under your hips. You may place padding under your knees for comfort.  Find your neutral spine and gently tense your abdominal muscles so that you can maintain this position. Your shoulders and hips should form a rectangle that is parallel with the floor and is not twisted.  Keeping your trunk steady, lift your right hand no higher than your shoulder and then your left leg no higher than your hip. Make sure you are not holding your breath. Hold this position __________ seconds.  Continuing to keep your abdominal muscles tense and your back steady, slowly return to your starting position. Repeat with the opposite arm and leg. Repeat __________ times. Complete this exercise __________ times per day.  STRENGTHENING - Abdominals and Quadriceps, Straight Leg Raise   Lie on a firm bed or floor with both legs extended in front of you.  Keeping one leg in contact with the floor, bend the other knee so that your foot can rest flat on the floor.  Find your neutral spine, and tense your abdominal muscles to maintain your spinal position throughout the exercise.  Slowly lift your straight leg off the floor about 6 inches for a count of 15, making sure to not hold your  breath.  Still keeping your neutral spine, slowly lower your leg all the way to the floor. Repeat this exercise with each leg __________ times. Complete this exercise __________ times per day. POSTURE AND BODY MECHANICS CONSIDERATIONS - Sciatica Keeping correct posture when sitting, standing or completing your activities will reduce the stress put on different body tissues, allowing injured tissues a chance to heal and limiting painful experiences. The following are general guidelines for improved posture. Your physician or physical therapist will provide you with any instructions specific to your needs. While reading these guidelines, remember:  The exercises prescribed by your provider will help you have the flexibility and strength to maintain correct postures.  The correct posture provides the optimal environment for your joints to work. All of your joints have less wear and tear when properly supported by a spine with good posture. This means you will experience a healthier, less painful body.  Correct posture must be practiced with all of your activities, especially prolonged sitting and standing. Correct posture is as important when doing repetitive low-stress activities (typing) as it   is when doing a single heavy-load activity (lifting). RESTING POSITIONS Consider which positions are most painful for you when choosing a resting position. If you have pain with flexion-based activities (sitting, bending, stooping, squatting), choose a position that allows you to rest in a less flexed posture. You would want to avoid curling into a fetal position on your side. If your pain worsens with extension-based activities (prolonged standing, working overhead), avoid resting in an extended position such as sleeping on your stomach. Most people will find more comfort when they rest with their spine in a more neutral position, neither too rounded nor too arched. Lying on a non-sagging bed on your side with a  pillow between your knees, or on your back with a pillow under your knees will often provide some relief. Keep in mind, being in any one position for a prolonged period of time, no matter how correct your posture, can still lead to stiffness. PROPER SITTING POSTURE In order to minimize stress and discomfort on your spine, you must sit with correct posture Sitting with good posture should be effortless for a healthy body. Returning to good posture is a gradual process. Many people can work toward this most comfortably by using various supports until they have the flexibility and strength to maintain this posture on their own. When sitting with proper posture, your ears will fall over your shoulders and your shoulders will fall over your hips. You should use the back of the chair to support your upper back. Your low back will be in a neutral position, just slightly arched. You may place a small pillow or folded towel at the base of your low back for support.  When working at a desk, create an environment that supports good, upright posture. Without extra support, muscles fatigue and lead to excessive strain on joints and other tissues. Keep these recommendations in mind: CHAIR:   A chair should be able to slide under your desk when your back makes contact with the back of the chair. This allows you to work closely.  The chair's height should allow your eyes to be level with the upper part of your monitor and your hands to be slightly lower than your elbows. BODY POSITION  Your feet should make contact with the floor. If this is not possible, use a foot rest.  Keep your ears over your shoulders. This will reduce stress on your neck and low back. INCORRECT SITTING POSTURES   If you are feeling tired and unable to assume a healthy sitting posture, do not slouch or slump. This puts excessive strain on your back tissues, causing more damage and pain. Healthier options include:  Using more support, like a  lumbar pillow.  Switching tasks to something that requires you to be upright or walking.  Talking a brief walk.  Lying down to rest in a neutral-spine position. PROLONGED STANDING WHILE SLIGHTLY LEANING FORWARD  When completing a task that requires you to lean forward while standing in one place for a long time, place either foot up on a stationary 2-4 inch high object to help maintain the best posture. When both feet are on the ground, the low back tends to lose its slight inward curve. If this curve flattens (or becomes too large), then the back and your other joints will experience too much stress, fatigue more quickly and can cause pain.  CORRECT STANDING POSTURES Proper standing posture should be assumed with all daily activities, even if they only take a few   moments, like when brushing your teeth. As in sitting, your ears should fall over your shoulders and your shoulders should fall over your hips. You should keep a slight tension in your abdominal muscles to brace your spine. Your tailbone should point down to the ground, not behind your body, resulting in an over-extended swayback posture.  INCORRECT STANDING POSTURES  Common incorrect standing postures include a forward head, locked knees and/or an excessive swayback. WALKING Walk with an upright posture. Your ears, shoulders and hips should all line-up. PROLONGED ACTIVITY IN A FLEXED POSITION When completing a task that requires you to bend forward at your waist or lean over a low surface, try to find a way to stabilize 3 of 4 of your limbs. You can place a hand or elbow on your thigh or rest a knee on the surface you are reaching across. This will provide you more stability so that your muscles do not fatigue as quickly. By keeping your knees relaxed, or slightly bent, you will also reduce stress across your low back. CORRECT LIFTING TECHNIQUES DO :   Assume a wide stance. This will provide you more stability and the opportunity to  get as close as possible to the object which you are lifting.  Tense your abdominals to brace your spine; then bend at the knees and hips. Keeping your back locked in a neutral-spine position, lift using your leg muscles. Lift with your legs, keeping your back straight.  Test the weight of unknown objects before attempting to lift them.  Try to keep your elbows locked down at your sides in order get the best strength from your shoulders when carrying an object.  Always ask for help when lifting heavy or awkward objects. INCORRECT LIFTING TECHNIQUES DO NOT:   Lock your knees when lifting, even if it is a small object.  Bend and twist. Pivot at your feet or move your feet when needing to change directions.  Assume that you cannot safely pick up a paperclip without proper posture.   This information is not intended to replace advice given to you by your health care provider. Make sure you discuss any questions you have with your health care provider.   Document Released: 08/15/2005 Document Revised: 12/30/2014 Document Reviewed: 11/27/2008 Elsevier Interactive Patient Education 2016 Else[employer] Inc.  

## 2016-02-02 NOTE — Progress Notes (Addendum)
  Subjective:  Kristina Robbins is a 20 y.o. [phone] Caucasian female at 2182w6d by 5wk u/s, being seen today for her first obstetrical visit.  Her obstetrical history is significant for term uncomplicated svb x1, sab x 2.  Pregnancy history fully reviewed.  Patient reports some sciatica. Denies vb, cramping, uti s/s, abnormal/malodorous vag d/c, or vulvovaginal itching/irritation.  BP 112/60 mmHg  Pulse 88  Wt 162 lb (73.483 kg)  LMP 11/11/2015  HISTORY: OB History  Gravida Para Term Preterm AB SAB TAB Ectopic Multiple Living  4 1 1  2 2    0 1    # Outcome Date GA Lbr Len/2nd Weight Sex Delivery Anes PTL Lv  4 Current           3 Term 03/06/15 7972w6d 17:26 / 01:13 7 lb 10.2 oz (3.464 kg) M Vag-Spont EPI N Y     Comments: none  2 SAB           1 SAB              Past Medical History  Diagnosis Date  . Irritable bowel syndrome   . Cystic fibroadenosis of breast   . Missed period 05/22/2015  . Pregnant 12/14/2015   Past Surgical History  Procedure Laterality Date  . No past surgeries     Family History  Problem Relation Age of Onset  . Cystic fibrosis Mother   . Mental illness Mother   . Depression Mother   . Bipolar disorder Mother   . Paranoid behavior Mother   . Schizophrenia Mother   . Diabetes Mother   . Hypertension Mother   . Autism Brother   . Other Maternal Grandfather     muscle degeneration  . Diabetes Maternal Grandfather   . Hypertension Maternal Grandfather   . Hypertension Maternal Grandmother   . Hypertension Paternal Grandmother   . Hypertension Paternal Grandfather   . Diabetes Paternal Uncle   . Hypertension Paternal Uncle     Exam   System:     General: Well developed & nourished, no acute distress   Skin: Warm & dry, normal coloration and turgor, no rashes   Neurologic: Alert & oriented, normal mood   Cardiovascular: Regular rate & rhythm   Respiratory: Effort & rate normal, LCTAB, acyanotic   Abdomen: Soft, non tender   Extremities: normal strength, tone  Thin prep pap smear neg 2015 at [place] FHR: 165 via doppler   Assessment:   Pregnancy: [id number] Patient Active Problem List   Diagnosis Date Noted  . Supervision of normal pregnancy 02/02/2016    Priority: High  . Umbilical hernia without obstruction and without gangrene 01/21/2015    1082w6d G4P1021 New OB visit Sciatica  Plan:  Already scheduled Fri for 1st it/nt, will do PN1 then Continue prenatal vitamins Problem list reviewed and updated Reviewed n/v relief measures and warning s/s to report Reviewed recommended weight gain based on pre-gravid BMI Encouraged well-balanced diet Genetic Screening discussed Integrated Screen: requested Cystic fibrosis screening discussed declined Ultrasound discussed; fetal survey: requested Follow up as scheduled Fri for 1st nt/it and pn1 (no visit), then 4 weeks for 2nd IT and visit [facility] completed Gave printed sciatica info  Marge DuncansBooker, Kimberly Randall CNM, [facility] 02/02/2016 2:27 PM

## 2016-02-03 LAB — URINALYSIS, ROUTINE W REFLEX MICROSCOPIC
BILIRUBIN UA: NEGATIVE
GLUCOSE, UA: NEGATIVE
KETONES UA: NEGATIVE
Leukocytes, UA: NEGATIVE
Nitrite, UA: NEGATIVE
RBC, UA: NEGATIVE
Urobilinogen, Ur: 1 mg/dL (ref 0.2–1.0)
pH, UA: 6 (ref 5.0–7.5)

## 2016-02-03 LAB — PMP SCREEN PROFILE (10S), URINE
AMPHETAMINE SCRN UR: NEGATIVE ng/mL
Barbiturate Screen, Ur: NEGATIVE ng/mL
Benzodiazepine Screen, Urine: NEGATIVE ng/mL
CREATININE(CRT), U: 263.2 mg/dL (ref 20.0–300.0)
Cannabinoids Ur Ql Scn: NEGATIVE ng/mL
Cocaine(Metab.)Screen, Urine: NEGATIVE ng/mL
METHADONE SCREEN, URINE: NEGATIVE ng/mL
OPIATE SCRN UR: NEGATIVE ng/mL
OXYCODONE+OXYMORPHONE UR QL SCN: NEGATIVE ng/mL
PCP SCRN UR: NEGATIVE ng/mL
PH UR, DRUG SCRN: 5.7 (ref 4.5–8.9)
PROPOXYPHENE SCREEN: NEGATIVE ng/mL

## 2016-02-03 LAB — GC/CHLAMYDIA PROBE AMP
Chlamydia trachomatis, NAA: NEGATIVE
Neisseria gonorrhoeae by PCR: NEGATIVE

## 2016-02-03 LAB — URINE CULTURE

## 2016-02-05 ENCOUNTER — Ambulatory Visit (INDEPENDENT_AMBULATORY_CARE_PROVIDER_SITE_OTHER): Payer: BC Managed Care – PPO

## 2016-02-05 ENCOUNTER — Other Ambulatory Visit: Payer: BC Managed Care – PPO

## 2016-02-05 DIAGNOSIS — Z36 Encounter for antenatal screening of mother: Secondary | ICD-10-CM

## 2016-02-05 DIAGNOSIS — Z3491 Encounter for supervision of normal pregnancy, unspecified, first trimester: Secondary | ICD-10-CM

## 2016-02-05 DIAGNOSIS — Z3A12 12 weeks gestation of pregnancy: Secondary | ICD-10-CM | POA: Diagnosis not present

## 2016-02-05 DIAGNOSIS — Z3682 Encounter for antenatal screening for nuchal translucency: Secondary | ICD-10-CM

## 2016-02-05 NOTE — Progress Notes (Signed)
US 11+2 wks,CRL 57.8 mm,NB present,NT 1.341mm,normal ov's bilat,fhr 159 bpm

## 2016-02-08 LAB — MATERNAL SCREEN, INTEGRATED #1
CROWN RUMP LENGTH MAT SCREEN: 57.8 mm
GEST. AGE ON COLLECTION DATE: 12.3 wk
MATERNAL AGE AT EDD: 22.9 a
NUMBER OF FETUSES: 1
Nuchal Translucency (NT): 1.1 mm
PAPP-A Value: 560.6 ng/mL
WEIGHT: 161 [lb_av]

## 2016-02-23 ENCOUNTER — Encounter: Payer: BC Managed Care – PPO | Admitting: Women's Health

## 2016-02-29 ENCOUNTER — Encounter: Payer: BC Managed Care – PPO | Admitting: Women's Health

## 2016-03-14 ENCOUNTER — Encounter: Payer: Self-pay | Admitting: Women's Health

## 2016-03-14 ENCOUNTER — Encounter: Payer: BC Managed Care – PPO | Admitting: Women's Health

## 2016-03-24 ENCOUNTER — Ambulatory Visit (INDEPENDENT_AMBULATORY_CARE_PROVIDER_SITE_OTHER): Payer: BC Managed Care – PPO | Admitting: Advanced Practice Midwife

## 2016-03-24 ENCOUNTER — Encounter: Payer: Self-pay | Admitting: Advanced Practice Midwife

## 2016-03-24 ENCOUNTER — Other Ambulatory Visit: Payer: Self-pay | Admitting: Advanced Practice Midwife

## 2016-03-24 VITALS — BP 138/88 | HR 112 | Wt 158.5 lb

## 2016-03-24 DIAGNOSIS — L02412 Cutaneous abscess of left axilla: Secondary | ICD-10-CM

## 2016-03-24 DIAGNOSIS — Z3492 Encounter for supervision of normal pregnancy, unspecified, second trimester: Secondary | ICD-10-CM

## 2016-03-24 DIAGNOSIS — Z331 Pregnant state, incidental: Secondary | ICD-10-CM

## 2016-03-24 DIAGNOSIS — Z3A18 18 weeks gestation of pregnancy: Secondary | ICD-10-CM

## 2016-03-24 DIAGNOSIS — Z369 Encounter for antenatal screening, unspecified: Secondary | ICD-10-CM

## 2016-03-24 DIAGNOSIS — Z1389 Encounter for screening for other disorder: Secondary | ICD-10-CM

## 2016-03-24 DIAGNOSIS — Z3482 Encounter for supervision of other normal pregnancy, second trimester: Secondary | ICD-10-CM

## 2016-03-24 LAB — POCT URINALYSIS DIPSTICK
Glucose, UA: NEGATIVE
KETONES UA: NEGATIVE
Leukocytes, UA: NEGATIVE
Nitrite, UA: NEGATIVE
PROTEIN UA: NEGATIVE
RBC UA: NEGATIVE

## 2016-03-24 MED ORDER — BUTALBITAL-APAP-CAFFEINE 50-325-40 MG PO TABS: 1.0000 | ORAL_TABLET | Freq: Four times a day (QID) | ORAL | 0 refills | Status: DC | PRN

## 2016-03-24 MED ORDER — CLINDAMYCIN PHOSPHATE 1 % EX GEL: Freq: Two times a day (BID) | CUTANEOUS | 11 refills | Status: DC

## 2016-03-24 MED ORDER — OMEPRAZOLE 20 MG PO CPDR: 20.0000 mg | DELAYED_RELEASE_CAPSULE | Freq: Every day | ORAL | 1 refills | Status: DC

## 2016-03-24 MED ORDER — CLINDAMYCIN HCL 300 MG PO CAPS: 300.0000 mg | ORAL_CAPSULE | Freq: Three times a day (TID) | ORAL | 0 refills | Status: DC

## 2016-03-24 NOTE — Progress Notes (Signed)
[id number] [redacted]w[redacted]d Estimated Date of Delivery: 08/24/16  Blood pressure 140/88, pulse (!) 112, weight 158 lb 8 oz (71.9 kg), last menstrual period 11/11/2015, not currently breastfeeding.    Pt has not been seen in 8 weeks. No real excuse.  BP weight and urine results all reviewed and noted.  Please refer to the obstetrical flow sheet for the fundal height and fetal heart rate documentation:  Patient reports good fetal movement, denies any bleeding and no rupture of membranes symptoms or regular contractions. Patient c/o HA (long history);  rx fioricet Has "chronic" yst under left arm, sometimes has green pus.  Today looks OK, small amount of pus extracted.  No erythema. Culture sent.  All questions were answered.  Orders Placed This Encounter  Procedures  . Maternal Screen, Integrated #2  . POCT urinalysis dipstick    Plan:  Continued routine obstetrical care,  Orders Placed This Encounter  Procedures  . GC/Chlamydia Probe Amp  . Urine culture  . Wound culture  . Maternal Screen, Integrated #2  . RPR  . HIV antibody  . Hepatitis B surface antigen  . Pain Management Screening Profile (10S)  . Urinalysis, Routine w reflex microscopic (not at [facility])  . Varicella zoster antibody, IgG  . CBC  . POCT urinalysis dipstick  . Antibody screen     Return in about 2 weeks (around 04/07/2016) for LROB, OM:[id number].

## 2016-03-24 NOTE — Patient Instructions (Signed)

## 2016-03-24 NOTE — Progress Notes (Signed)
Pt states that she has a place under her left arm in the armpit area that is hard and has drained green drainage. Pt states that she is having terrible migraines.

## 2016-03-26 LAB — URINALYSIS, ROUTINE W REFLEX MICROSCOPIC
Bilirubin, UA: NEGATIVE
GLUCOSE, UA: NEGATIVE
Ketones, UA: NEGATIVE
NITRITE UA: NEGATIVE
Protein, UA: NEGATIVE
RBC, UA: NEGATIVE
Specific Gravity, UA: 1.017 (ref 1.005–1.030)
UUROB: 0.2 mg/dL (ref 0.2–1.0)
pH, UA: 6.5 (ref 5.0–7.5)

## 2016-03-26 LAB — CBC
HEMATOCRIT: 33.4 % — AB (ref 34.0–46.6)
Hemoglobin: 11.7 g/dL (ref 11.1–15.9)
MCH: 30 pg (ref 26.6–33.0)
MCHC: 35 g/dL (ref 31.5–35.7)
MCV: 86 fL (ref 79–97)
PLATELETS: 237 10*3/uL (ref 150–379)
RBC: 3.9 x10E6/uL (ref 3.77–5.28)
RDW: 14.3 % (ref 12.3–15.4)
WBC: 7.1 10*3/uL (ref 3.4–10.8)

## 2016-03-26 LAB — MICROSCOPIC EXAMINATION
Casts: NONE SEEN /lpf
Epithelial Cells (non renal): >10 /hpf — AB (ref 0–10)

## 2016-03-26 LAB — PMP SCREEN PROFILE (10S), URINE
Amphetamine Screen, Ur: NEGATIVE ng/mL
BARBITURATE SCRN UR: NEGATIVE ng/mL
BENZODIAZEPINE SCREEN, URINE: NEGATIVE ng/mL
CANNABINOIDS UR QL SCN: NEGATIVE ng/mL
COCAINE(METAB.) SCREEN, URINE: NEGATIVE ng/mL
Creatinine(Crt), U: 86.3 mg/dL (ref 20.0–300.0)
METHADONE SCREEN, URINE: NEGATIVE ng/mL
OPIATE SCRN UR: NEGATIVE ng/mL
Oxycodone+Oxymorphone Ur Ql Scn: NEGATIVE ng/mL
PCP Scrn, Ur: NEGATIVE ng/mL
PROPOXYPHENE SCREEN: NEGATIVE ng/mL
Ph of Urine: 6.3 (ref 4.5–8.9)

## 2016-03-26 LAB — GC/CHLAMYDIA PROBE AMP
Chlamydia trachomatis, NAA: NEGATIVE
NEISSERIA GONORRHOEAE BY PCR: NEGATIVE

## 2016-03-26 LAB — AB SCR+ANTIBODY ID: ANTIBODY SCREEN: POSITIVE — AB

## 2016-03-26 LAB — RPR: RPR Ser Ql: NONREACTIVE

## 2016-03-26 LAB — HEPATITIS B SURFACE ANTIGEN: HEP B S AG: NEGATIVE

## 2016-03-26 LAB — HIV ANTIBODY (ROUTINE TESTING W REFLEX): HIV Screen 4th Generation wRfx: NONREACTIVE

## 2016-03-26 LAB — ANTIBODY SCREEN

## 2016-03-26 LAB — URINE CULTURE

## 2016-03-26 LAB — VARICELLA ZOSTER ANTIBODY, IGG: VARICELLA: 173 {index} (ref >165)

## 2016-03-27 LAB — WOUND CULTURE: Organism ID, Bacteria: NONE SEEN

## 2016-03-29 LAB — MATERNAL SCREEN, INTEGRATED #2
ADSF: 1.06
AFP MARKER: 50 ng/mL
AFP MOM: 1.11
CROWN RUMP LENGTH: 57.8 mm
DIA MoM: 2.28
DIA Value: 409.8 pg/mL
Estriol, Unconjugated: 1.67 ng/mL
GEST. AGE ON COLLECTION DATE: 12.3 wk
GESTATIONAL AGE: 19.1 wk
Maternal Age at EDD: 22.9 years
Nuchal Translucency (NT): 1.1 mm
Nuchal Translucency MoM: 0.74
Number of Fetuses: 1
PAPP-A MOM: 0.69
PAPP-A VALUE: 560.6 ng/mL
TEST RESULTS: NEGATIVE
WEIGHT: 161 [lb_av]
Weight: 161 [lb_av]
hCG MoM: 1.08
hCG Value: 23.5 IU/mL

## 2016-04-06 ENCOUNTER — Other Ambulatory Visit: Payer: Self-pay | Admitting: Advanced Practice Midwife

## 2016-04-06 DIAGNOSIS — Z1389 Encounter for screening for other disorder: Secondary | ICD-10-CM

## 2016-04-07 ENCOUNTER — Ambulatory Visit (INDEPENDENT_AMBULATORY_CARE_PROVIDER_SITE_OTHER): Payer: BC Managed Care – PPO | Admitting: Obstetrics & Gynecology

## 2016-04-07 ENCOUNTER — Ambulatory Visit (INDEPENDENT_AMBULATORY_CARE_PROVIDER_SITE_OTHER): Payer: BC Managed Care – PPO

## 2016-04-07 VITALS — BP 126/70 | HR 104 | Wt 164.0 lb

## 2016-04-07 DIAGNOSIS — O321XX1 Maternal care for breech presentation, fetus 1: Secondary | ICD-10-CM

## 2016-04-07 DIAGNOSIS — Z36 Encounter for antenatal screening of mother: Secondary | ICD-10-CM

## 2016-04-07 DIAGNOSIS — Z3A21 21 weeks gestation of pregnancy: Secondary | ICD-10-CM

## 2016-04-07 DIAGNOSIS — Z3482 Encounter for supervision of other normal pregnancy, second trimester: Secondary | ICD-10-CM

## 2016-04-07 DIAGNOSIS — Z3492 Encounter for supervision of normal pregnancy, unspecified, second trimester: Secondary | ICD-10-CM

## 2016-04-07 DIAGNOSIS — Z1389 Encounter for screening for other disorder: Secondary | ICD-10-CM

## 2016-04-07 DIAGNOSIS — Z331 Pregnant state, incidental: Secondary | ICD-10-CM

## 2016-04-07 NOTE — Progress Notes (Signed)
[id number] 6410w1d Estimated Date of Delivery: 08/24/16  Blood pressure 126/70, pulse (!) 104, weight 164 lb (74.4 kg), last menstrual period 11/11/2015, not currently breastfeeding.   BP weight and urine results all reviewed and noted.  Please refer to the obstetrical flow sheet for the fundal height and fetal heart rate documentation:  Patient reports good fetal movement, denies any bleeding and no rupture of membranes symptoms or regular contractions. Patient is without complaints. All questions were answered.  Orders Placed This Encounter  Procedures  . POCT urinalysis dipstick    Plan:  Continued routine obstetrical care, 20 week scan is normal see report  Return in about 4 weeks (around 05/05/2016) for LROB.

## 2016-04-07 NOTE — Progress Notes (Signed)
US 20+1 wks,breech,cx 4.3 cm,ant pl gr 0,normal ov's bilat,svp of fluid 6 cm,fhr 142 bpm,efw 355 g, anatomy complete,no obvious abnormalities seen

## 2016-04-25 ENCOUNTER — Encounter: Payer: Self-pay | Admitting: Women's Health

## 2016-05-01 ENCOUNTER — Encounter: Payer: Self-pay | Admitting: Women's Health

## 2016-05-05 ENCOUNTER — Ambulatory Visit (INDEPENDENT_AMBULATORY_CARE_PROVIDER_SITE_OTHER): Payer: BC Managed Care – PPO | Admitting: Obstetrics and Gynecology

## 2016-05-05 ENCOUNTER — Encounter: Payer: Self-pay | Admitting: Obstetrics and Gynecology

## 2016-05-05 VITALS — BP 98/62 | HR 96 | Wt 165.5 lb

## 2016-05-05 DIAGNOSIS — Z331 Pregnant state, incidental: Secondary | ICD-10-CM

## 2016-05-05 DIAGNOSIS — Z1389 Encounter for screening for other disorder: Secondary | ICD-10-CM

## 2016-05-05 DIAGNOSIS — Z3492 Encounter for supervision of normal pregnancy, unspecified, second trimester: Secondary | ICD-10-CM

## 2016-05-05 LAB — POCT URINALYSIS DIPSTICK
GLUCOSE UA: NEGATIVE
KETONES UA: NEGATIVE
Leukocytes, UA: NEGATIVE
NITRITE UA: NEGATIVE
Protein, UA: NEGATIVE
RBC UA: NEGATIVE

## 2016-05-05 NOTE — Progress Notes (Signed)
Kristina Robbins is a 20 y.o. female  [phone]  Estimated Date of Delivery: 08/24/16 LROB 7270w1d her husband has 3 prior children and they're discussing contraception methods. She desires permanent sterilization or long-acting method. He desires to have 1 more pregnancy as he has 3 living female children and lost a female child 2017 to a metabolic disorder and H2  Blood pressure 98/62, pulse 96, weight 165 lb 8 oz (75.1 kg), last menstrual period 11/11/2015, not currently breastfeeding.   Urine results: Negative   Chief Complaint  Patient presents with  . Routine Prenatal Visit    on Monistat for yeast    Patient has no acute complaints at this time. Patient reports good fetal movement,                          denies any bleeding , rupture of membranes,or regular contractions.   refer to the ob flow sheet for FH and FHR                           Physical Examination: General appearance - alert, well appearing, and in no distress                                      Abdomen - FH 25 cm,                                                         -FHR 137                                                         soft, nontender,  1 cm asymptomatic abdominal hernia                                      Pelvic - examination not indicated                                            Questions were answered. Assessment: LROB [id number] @ 6070w1d                    Uncertain contraception plans Plan:  Continued routine obstetrical care  F/u in 4 weeks for routine prenatal care  By signing my name below, I, Freida Busmaniana Omoyeni, attest that this documentation has been prepared under the direction and in the presence of Tilda BurrowJohn V Bradely Rudin, MD . Electronically Signed: Freida Busmaniana Omoyeni, Scribe. 05/05/2016. 2:39 PM. I personally performed the services described in this documentation, which was SCRIBED in my presence. The recorded information has been reviewed and considered accurate. It has been edited as necessary  during review. Tilda BurrowFERGUSON,Kenzi Bardwell V, MD

## 2016-05-09 ENCOUNTER — Encounter: Payer: Self-pay | Admitting: Women's Health

## 2016-05-09 ENCOUNTER — Ambulatory Visit (INDEPENDENT_AMBULATORY_CARE_PROVIDER_SITE_OTHER): Payer: BC Managed Care – PPO | Admitting: Women's Health

## 2016-05-09 ENCOUNTER — Telehealth: Payer: Self-pay | Admitting: Women's Health

## 2016-05-09 VITALS — BP 130/70 | HR 100 | Wt 166.0 lb

## 2016-05-09 DIAGNOSIS — Z331 Pregnant state, incidental: Secondary | ICD-10-CM | POA: Diagnosis not present

## 2016-05-09 DIAGNOSIS — J069 Acute upper respiratory infection, unspecified: Secondary | ICD-10-CM | POA: Diagnosis not present

## 2016-05-09 DIAGNOSIS — H6691 Otitis media, unspecified, right ear: Secondary | ICD-10-CM | POA: Diagnosis not present

## 2016-05-09 DIAGNOSIS — Z3492 Encounter for supervision of normal pregnancy, unspecified, second trimester: Secondary | ICD-10-CM

## 2016-05-09 DIAGNOSIS — Z1389 Encounter for screening for other disorder: Secondary | ICD-10-CM

## 2016-05-09 LAB — POCT URINALYSIS DIPSTICK
Blood, UA: NEGATIVE
Glucose, UA: NEGATIVE
KETONES UA: NEGATIVE
LEUKOCYTES UA: NEGATIVE
Nitrite, UA: NEGATIVE
Protein, UA: NEGATIVE

## 2016-05-09 MED ORDER — AMOXICILLIN 500 MG PO CAPS: 500.0000 mg | ORAL_CAPSULE | Freq: Two times a day (BID) | ORAL | 0 refills | Status: DC

## 2016-05-09 NOTE — Progress Notes (Signed)
[facility]  Patient name: Mattie MarlinBrittany Hiatt Scovill MRN [id number]  Date of birth: [birth year]  CC & HPI:  Mattie MarlinBrittany Hiatt Caissie is a 20 y.o. [phone] Caucasian female at 9215w5d presenting today for report of congestion, runny nose, chest pain, cough, scratchy throat, headache, Rt ear pain- has h/o multiple ear infections requiring antibiotics and feels this is same, fever of 101 axillary last night- but states she usually runs high and had been bundled up in blankets so not sure if it was 'true' fever, all sx started 2d ago. Husband and her son both were sick, but there's only lasted 24hrs or less.  Also reports swelling Lt labia, has h/o bartholin's cyst, but this doesn't hurt. Had yeast infection last week, used otc monistat.  Patient's last menstrual period was 11/11/2015.  Pertinent History Reviewed:  Medical & Surgical Hx:   Past medical, surgical, family, and social history reviewed in electronic medical record Medications: Reviewed & Updated - see associated section Allergies: Reviewed in electronic medical record  Objective Findings:  Vitals: BP 130/70   Pulse 100   Wt 166 lb (75.3 kg)   LMP 11/11/2015   BMI 29.41 kg/m  Body mass index is 29.41 kg/m.  Physical Examination: General appearance - alert, well appearing, and in no distress Ears - Lt TM clear, Rt TM erythematous w/ mild bulging Mouth - mucous membranes moist, pharynx normal without lesions Neck - +Rt anterior cervical lymph node Chest - clear to auscultation, no wheezes, rales or rhonchi, symmetric air entry Heart - normal rate and regular rhythm Pelvic - normal external genitalia, no signs of bartholin's, nontender, possibly pelvic congestion vs. Inflammation from recent yeast infection  Results for orders placed or performed in visit on 05/09/16 (from the past 24 hour(s))  POCT urinalysis dipstick   Collection Time: 05/09/16 10:56 AM  Result Value Ref Range   Color, UA     Clarity, UA      Glucose, UA neg    Bilirubin, UA     Ketones, UA neg    Spec Grav, UA     Blood, UA neg    pH, UA     Protein, UA neg    Urobilinogen, UA     Nitrite, UA neg    Leukocytes, UA Negative Negative     Assessment & Plan:  A:   5315w5d pregnant  Rt ear infection  URI   Resolved Lt labial swelling  P:  Rx amoxicillin 500mg  BID x 7d   Gave printed info on relief measures, otc meds  Let us know if not improving or worsening  Return for As scheduled.  Marge DuncansBooker, Chidiebere Wynn Randall CNM, [facility] 05/09/2016 11:37 AM

## 2016-05-09 NOTE — Telephone Encounter (Signed)
Pt states she has spoke with one of our staff and was given an appt for this am for evaluation.

## 2016-05-09 NOTE — Patient Instructions (Addendum)
   Humidifier and saline nasal spray for nasal congestion  Regular robitussin, cough drops for cough  Warm salt water gargles for sore throat  Mucinex with lots of water to help you cough up the mucous in your chest if needed  Drink plenty of fluids and stay hydrated!  Wash your hands frequently.  Call if you are not improving by 7-10 days.    

## 2016-05-13 ENCOUNTER — Encounter: Payer: Self-pay | Admitting: Women's Health

## 2016-05-30 ENCOUNTER — Encounter (HOSPITAL_COMMUNITY): Payer: Self-pay

## 2016-05-30 ENCOUNTER — Telehealth: Payer: Self-pay | Admitting: Women's Health

## 2016-05-30 ENCOUNTER — Inpatient Hospital Stay (HOSPITAL_COMMUNITY)
Admission: AD | Admit: 2016-05-30 | Discharge: 2016-05-30 | Disposition: A | Discharge status: Home / Self Care | Payer: Medicaid Other | Source: Ambulatory Visit | Attending: Obstetrics and Gynecology | Admitting: Obstetrics and Gynecology

## 2016-05-30 DIAGNOSIS — O99332 Smoking (tobacco) complicating pregnancy, second trimester: Secondary | ICD-10-CM | POA: Insufficient documentation

## 2016-05-30 DIAGNOSIS — M549 Dorsalgia, unspecified: Secondary | ICD-10-CM | POA: Diagnosis not present

## 2016-05-30 DIAGNOSIS — O9989 Other specified diseases and conditions complicating pregnancy, childbirth and the puerperium: Secondary | ICD-10-CM

## 2016-05-30 DIAGNOSIS — Z3689 Encounter for other specified antenatal screening: Secondary | ICD-10-CM

## 2016-05-30 DIAGNOSIS — O26892 Other specified pregnancy related conditions, second trimester: Secondary | ICD-10-CM | POA: Insufficient documentation

## 2016-05-30 DIAGNOSIS — F17210 Nicotine dependence, cigarettes, uncomplicated: Secondary | ICD-10-CM | POA: Insufficient documentation

## 2016-05-30 DIAGNOSIS — Z3A27 27 weeks gestation of pregnancy: Secondary | ICD-10-CM | POA: Diagnosis not present

## 2016-05-30 DIAGNOSIS — Z3482 Encounter for supervision of other normal pregnancy, second trimester: Secondary | ICD-10-CM

## 2016-05-30 DIAGNOSIS — O99891 Other specified diseases and conditions complicating pregnancy: Secondary | ICD-10-CM

## 2016-05-30 HISTORY — DX: Pityriasis rosea: L42

## 2016-05-30 LAB — URINALYSIS, ROUTINE W REFLEX MICROSCOPIC
Bilirubin Urine: NEGATIVE
Glucose, UA: NEGATIVE mg/dL
Hgb urine dipstick: NEGATIVE
Ketones, ur: NEGATIVE mg/dL
NITRITE: NEGATIVE
PROTEIN: NEGATIVE mg/dL
SPECIFIC GRAVITY, URINE: 1.01 (ref 1.005–1.030)
pH: 6 (ref 5.0–8.0)

## 2016-05-30 LAB — URINE MICROSCOPIC-ADD ON: RBC / HPF: NONE SEEN RBC/hpf (ref 0–5)

## 2016-05-30 LAB — WET PREP, GENITAL
CLUE CELLS WET PREP: NONE SEEN
SPERM: NONE SEEN
TRICH WET PREP: NONE SEEN
YEAST WET PREP: NONE SEEN

## 2016-05-30 LAB — FETAL FIBRONECTIN: FETAL FIBRONECTIN: NEGATIVE

## 2016-05-30 LAB — GLUCOSE, CAPILLARY: Glucose-Capillary: 106 mg/dL — ABNORMAL HIGH (ref 65–99)

## 2016-05-30 MED ORDER — ACETAMINOPHEN 500 MG PO TABS: 1000.0000 mg | ORAL_TABLET | Freq: Four times a day (QID) | ORAL | Status: DC | PRN

## 2016-05-30 NOTE — MAU Note (Signed)
Pt states that she is having contractions that have been going on since 0700 this morning.  Pt states that they contractions are 2 minutes apart.  Pt states she has not felt the baby move today.  Pt denies leaking of fluid but states she is having bloody discharge.

## 2016-05-30 NOTE — MAU Provider Note (Signed)
History     CSN: [id number]  Arrival date and time: 05/30/16 1133   None     Chief Complaint  Patient presents with  . Contractions   [id number] @27  wks c/o ctx q2 min since 0700. Reports ctx duration 1.5-2 min. Feeling mostly in her back. She reports +mucous discharge with a blood tinge. Recent IC 2 days ago. She reports increased clear vaginal discharge, no odor. She denies VB or LOF. Reports good FM. She reports worsening pressure with urination and increased frequency today. Pregnancy complicated by tobacco use-4 cig/day. She reports 1 bottle of water and 3 sodas today.   OB History    Gravida Para Term Preterm AB Living   4 1 1   2 1    SAB TAB Ectopic Multiple Live Births   2     0 1      Past Medical History:  Diagnosis Date  . Cystic fibroadenosis of breast   . Irritable bowel syndrome   . Missed period 05/22/2015  . Pityriasis rosea   . Pregnant 12/14/2015    Past Surgical History:  Procedure Laterality Date  . NO PAST SURGERIES      Family History  Problem Relation Age of Onset  . Cystic fibrosis Mother   . Mental illness Mother   . Depression Mother   . Bipolar disorder Mother   . Paranoid behavior Mother   . Schizophrenia Mother   . Diabetes Mother   . Hypertension Mother   . Autism Brother   . Other Maternal Grandfather     muscle degeneration  . Diabetes Maternal Grandfather   . Hypertension Maternal Grandfather   . Hypertension Maternal Grandmother   . Hypertension Paternal Grandmother   . Hypertension Paternal Grandfather   . Diabetes Paternal Uncle   . Hypertension Paternal Uncle     Social History  Substance Use Topics  . Smoking status: Former Smoker    Packs/day: 0.50    Years: 2.00    Types: Cigarettes  . Smokeless tobacco: Never Used  . Alcohol use No    Allergies:  Allergies  Allergen Reactions  . Omnicef [Cefdinir] Nausea Only and Other (See Comments)    Stomach cramps    Prescriptions Prior to Admission  Medication Sig  Dispense Refill Last Dose  . acetaminophen (TYLENOL) 500 MG tablet Take 500 mg by mouth every 6 (six) hours as needed for mild pain.    Taking  . amoxicillin (AMOXIL) 500 MG capsule Take 1 capsule (500 mg total) by mouth 2 (two) times daily. X 7 days 14 capsule 0   . butalbital-acetaminophen-caffeine (FIORICET) 50-325-40 MG tablet Take 1-2 tablets by mouth every 6 (six) hours as needed for headache. (Patient not taking: Reported on 05/09/2016) 20 tablet 0 Not Taking  . Ca Carbonate-Mag Hydroxide (ROLAIDS PO) Take 1-2 tablets by mouth as needed (heartburn).   Not Taking  . calcium carbonate (TUMS - DOSED IN MG ELEMENTAL CALCIUM) 500 MG chewable tablet Chew 1 tablet by mouth daily.   Taking  . flintstones complete (FLINTSTONES) 60 MG chewable tablet Take 2 daily   Taking  . Miconazole Nitrate (MONISTAT 7 VA) Place vaginally.   Not Taking  . omeprazole (PRILOSEC) 20 MG capsule Take 1 capsule (20 mg total) by mouth daily. 30 capsule 1 Taking  . Pseudoeph-Doxylamine-DM-APAP (NYQUIL MULTI-SYMPTOM PO) Take by mouth.   Taking    Review of Systems  Constitutional: Negative.   Gastrointestinal: Positive for abdominal pain.  Genitourinary: Positive for frequency.  Physical Exam   Blood pressure 110/63, pulse 112, temperature 97.7 F (36.5 C), temperature source Oral, resp. rate 18, height 5\' 3"  (1.6 m), weight 164 lb (74.4 kg), last menstrual period 11/11/2015, SpO2 100 %, not currently breastfeeding.  Physical Exam  Constitutional: She is oriented to person, place, and time. She appears well-developed and well-nourished.  HENT:  Head: Normocephalic and atraumatic.  Neck: Normal range of motion. Neck supple.  Cardiovascular: Normal rate.   Respiratory: Effort normal.  GI: Soft. She exhibits no distension. There is no tenderness.  gravid  Genitourinary:  Genitourinary Comments: External: no lesions Vagina: rugated, parous, thin white discharge SVE: closed/thick   Musculoskeletal: Normal range  of motion.  Neurological: She is alert and oriented to person, place, and time.  Skin: Skin is warm and dry.  Psychiatric: She has a normal mood and affect.  EFM: 135 bpm, mod variability, + accels, no decels Toco: irritability  Results for orders placed or performed during the hospital encounter of 05/30/16 (from the past 24 hour(s))  Urinalysis, Routine w reflex microscopic [facility])     Status: Abnormal   Collection Time: 05/30/16 11:43 AM  Result Value Ref Range   Color, Urine YELLOW YELLOW   APPearance CLOUDY (A) CLEAR   Specific Gravity, Urine 1.010 1.005 - 1.030   pH 6.0 5.0 - 8.0   Glucose, UA NEGATIVE NEGATIVE mg/dL   Hgb urine dipstick NEGATIVE NEGATIVE   Bilirubin Urine NEGATIVE NEGATIVE   Ketones, ur NEGATIVE NEGATIVE mg/dL   Protein, ur NEGATIVE NEGATIVE mg/dL   Nitrite NEGATIVE NEGATIVE   Leukocytes, UA SMALL (A) NEGATIVE  Urine microscopic-add on     Status: Abnormal   Collection Time: 05/30/16 11:43 AM  Result Value Ref Range   Squamous Epithelial / LPF 6-30 (A) NONE SEEN   WBC, UA 0-5 0 - 5 WBC/hpf   RBC / HPF NONE SEEN 0 - 5 RBC/hpf   Bacteria, UA FEW (A) NONE SEEN  Wet prep, genital     Status: Abnormal   Collection Time: 05/30/16 12:45 PM  Result Value Ref Range   Yeast Wet Prep HPF POC NONE SEEN NONE SEEN   Trich, Wet Prep NONE SEEN NONE SEEN   Clue Cells Wet Prep HPF POC NONE SEEN NONE SEEN   WBC, Wet Prep HPF POC MANY (A) NONE SEEN   Sperm NONE SEEN   Fetal fibronectin     Status: None   Collection Time: 05/30/16 12:45 PM  Result Value Ref Range   Fetal Fibronectin NEGATIVE NEGATIVE  Glucose, capillary     Status: Abnormal   Collection Time: 05/30/16  1:25 PM  Result Value Ref Range   Glucose-Capillary 106 (H) 65 - 99 mg/dL    MAU Course  Procedures Po hydration  MDM Labs ordered and reviewed. No evidence of PTL or UTI. Cramping and back pain likely dehydration. Stable for discharge home.  Assessment and Plan   1. Back pain affecting  pregnancy in second trimester   2. Encounter for supervision of other normal pregnancy in second trimester   3. NST (non-stress test) reactive    Discharge home Hydrate with water- 5-6 bottles per day PTL precautions Follow up with [facility] in 3 days Return for worsening sx    Medication List    STOP taking these medications   amoxicillin 500 MG capsule Commonly known as:  AMOXIL   butalbital-acetaminophen-caffeine 50-325-40 MG tablet Commonly known as:  FIORICET   MONISTAT 7 VA  NYQUIL MULTI-SYMPTOM PO     TAKE these medications   acetaminophen 500 MG tablet Commonly known as:  TYLENOL Take 500 mg by mouth every 6 (six) hours as needed for mild pain.   calcium carbonate 500 MG chewable tablet Commonly known as:  TUMS - dosed in mg elemental calcium Chew 1 tablet by mouth daily.   flintstones complete 60 MG chewable tablet Take 2 daily   omeprazole 20 MG capsule Commonly known as:  PRILOSEC Take 1 capsule (20 mg total) by mouth daily.   ROLAIDS PO Take 1-2 tablets by mouth as needed (heartburn).       Donette Larry, CNM 05/30/2016, 12:26 PM

## 2016-05-30 NOTE — Discharge Instructions (Signed)
Braxton Hicks Contractions °Contractions of the uterus can occur throughout pregnancy. Contractions are not always a sign that you are in labor.  °WHAT ARE BRAXTON HICKS CONTRACTIONS?  °Contractions that occur before labor are called Braxton Hicks contractions, or false labor. Toward the end of pregnancy (32-34 weeks), these contractions can develop more often and may become more forceful. This is not true labor because these contractions do not result in opening (dilatation) and thinning of the cervix. They are sometimes difficult to tell apart from true labor because these contractions can be forceful and people have different pain tolerances. You should not feel embarrassed if you go to the hospital with false labor. Sometimes, the only way to tell if you are in true labor is for your health care provider to look for changes in the cervix. °If there are no prenatal problems or other health problems associated with the pregnancy, it is completely safe to be sent home with false labor and await the onset of true labor. °HOW CAN YOU TELL THE DIFFERENCE BETWEEN TRUE AND FALSE LABOR? °False Labor °· The contractions of false labor are usually shorter and not as hard as those of true labor.   °· The contractions are usually irregular.   °· The contractions are often felt in the front of the lower abdomen and in the groin.   °· The contractions may go away when you walk around or change positions while lying down.   °· The contractions get weaker and are shorter lasting as time goes on.   °· The contractions do not usually become progressively stronger, regular, and closer together as with true labor.   °True Labor °· Contractions in true labor last 30-70 seconds, become very regular, usually become more intense, and increase in frequency.   °· The contractions do not go away with walking.   °· The discomfort is usually felt in the top of the uterus and spreads to the lower abdomen and low back.   °· True labor can be  determined by your health care provider with an exam. This will show that the cervix is dilating and getting thinner.   °WHAT TO REMEMBER °· Keep up with your usual exercises and follow other instructions given by your health care provider.   °· Take medicines as directed by your health care provider.   °· Keep your regular prenatal appointments.   °· Eat and drink lightly if you think you are going into labor.   °· If Braxton Hicks contractions are making you uncomfortable:   °· Change your position from lying down or resting to walking, or from walking to resting.   °· Sit and rest in a tub of warm water.   °· Drink 2-3 glasses of water. Dehydration may cause these contractions.   °· Do slow and deep breathing several times an hour.   °WHEN SHOULD I SEEK IMMEDIATE MEDICAL CARE? °Seek immediate medical care if: °· Your contractions become stronger, more regular, and closer together.   °· You have fluid leaking or gushing from your vagina.   °· You have a fever.   °· You pass blood-tinged mucus.   °· You have vaginal bleeding.   °· You have continuous abdominal pain.   °· You have low back pain that you never had before.   °· You feel your baby's head pushing down and causing pelvic pressure.   °· Your baby is not moving as much as it used to.   °  °This information is not intended to replace advice given to you by your health care provider. Make sure you discuss any questions you have with your health care   provider. °  °Document Released: 08/15/2005 Document Revised: 08/20/2013 Document Reviewed: 05/27/2013 °Elsevier Interactive Patient Education ©2016 Elsevier Inc. ° °Back Pain in Pregnancy °Back pain during pregnancy is common. It happens in about half of all pregnancies. It is important for you and your baby that you remain active during your pregnancy. If you feel that back pain is not allowing you to remain active or sleep well, it is time to see your caregiver. Back pain may be caused by several factors  related to changes during your pregnancy. Fortunately, unless you had trouble with your back before your pregnancy, the pain is likely to get better after you deliver. °Low back pain usually occurs between the fifth and seventh months of pregnancy. It can, however, happen in the first couple months. Factors that increase the risk of back problems include:  °· Previous back problems. °· Injury to your back. °· Having twins or multiple births. °· A chronic cough. °· Stress. °· Job-related repetitive motions. °· Muscle or spinal disease in the back. °· Family history of back problems, ruptured (herniated) discs, or osteoporosis. °· Depression, anxiety, and panic attacks. °CAUSES  °· When you are pregnant, your body produces a hormone called relaxin. This hormone makes the ligaments connecting the low back and pubic bones more flexible. This flexibility allows the baby to be delivered more easily. When your ligaments are loose, your muscles need to work harder to support your back. Soreness in your back can come from tired muscles. Soreness can also come from back tissues that are irritated since they are receiving less support. °· As the baby grows, it puts pressure on the nerves and blood vessels in your pelvis. This can cause back pain. °· As the baby grows and gets heavier during pregnancy, the uterus pushes the stomach muscles forward and changes your center of gravity. This makes your back muscles work harder to maintain good posture. °SYMPTOMS  °Lumbar pain during pregnancy °Lumbar pain during pregnancy usually occurs at or above the waist in the center of the back. There may be pain and numbness that radiates into your leg or foot. This is similar to low back pain experienced by non-pregnant women. It usually increases with sitting for long periods of time, standing, or repetitive lifting. Tenderness may also be present in the muscles along your upper back. °Posterior pelvic pain during pregnancy °Pain in the  back of the pelvis is more common than lumbar pain in pregnancy. It is a deep pain felt in your side at the waistline, or across the tailbone (sacrum), or in both places. You may have pain on one or both sides. This pain can also go into the buttocks and backs of the upper thighs. Pubic and groin pain may also be present. The pain does not quickly resolve with rest, and morning stiffness may also be present. °Pelvic pain during pregnancy can be brought on by most activities. A high level of fitness before and during pregnancy may or may not prevent this problem. Labor pain is usually 1 to 2 minutes apart, lasts for about 1 minute, and involves a bearing down feeling or pressure in your pelvis. However, if you are at term with the pregnancy, constant low back pain can be the beginning of early labor, and you should be aware of this. °DIAGNOSIS  °X-rays of the back should not be done during the first 12 to 14 weeks of the pregnancy and only when absolutely necessary during the rest of the pregnancy. MRIs do not give   off radiation and are safe during pregnancy. MRIs also should only be done when absolutely necessary. °HOME CARE INSTRUCTIONS °· Exercise as directed by your caregiver. Exercise is the most effective way to prevent or manage back pain. If you have a back problem, it is especially important to avoid sports that require sudden body movements. Swimming and walking are great activities. °· Do not stand in one place for long periods of time. °· Do not wear high heels. °· Sit in chairs with good posture. Use a pillow on your lower back if necessary. Make sure your head rests over your shoulders and is not hanging forward. °· Try sleeping on your side, preferably the left side, with a pillow or two between your legs. If you are sore after a night's rest, your bed may be too soft. Try placing a board between your mattress and box spring. °· Listen to your body when lifting. If you are experiencing pain, ask for  help or try bending your knees more so you can use your leg muscles rather than your back muscles. Squat down when picking up something from the floor. Do not bend over. °· Eat a healthy diet. Try to gain weight within your caregiver's recommendations. °· Use heat or cold packs 3 to 4 times a day for 15 minutes to help with the pain. °· Only take over-the-counter or prescription medicines for pain, discomfort, or fever as directed by your caregiver. °Sudden (acute) back pain °· Use bed rest for only the most extreme, acute episodes of back pain. Prolonged bed rest over 48 hours will aggravate your condition. °· Ice is very effective for acute conditions. °¨ Put ice in a plastic bag. °¨ Place a towel between your skin and the bag. °¨ Leave the ice on for 10 to 20 minutes every 2 hours, or as needed. °· Using heat packs for 30 minutes prior to activities is also helpful. °Continued back pain °See your caregiver if you have continued problems. Your caregiver can help or refer you for appropriate physical therapy. With conditioning, most back problems can be avoided. Sometimes, a more serious issue may be the cause of back pain. You should be seen right away if new problems seem to be developing. Your caregiver may recommend: °· A maternity girdle. °· An elastic sling. °· A back brace. °· A massage therapist or acupuncture. °SEEK MEDICAL CARE IF:  °· You are not able to do most of your daily activities, even when taking the pain medicine you were given. °· You need a referral to a physical therapist or chiropractor. °· You want to try acupuncture. °SEEK IMMEDIATE MEDICAL CARE IF: °· You develop numbness, tingling, weakness, or problems with the use of your arms or legs. °· You develop severe back pain that is no longer relieved with medicines. °· You have a sudden change in bowel or bladder control. °· You have increasing pain in other areas of the body. °· You develop shortness of breath, dizziness, or fainting. °· You  develop nausea, vomiting, or sweating. °· You have back pain which is similar to labor pains. °· You have back pain along with your water breaking or vaginal bleeding. °· You have back pain or numbness that travels down your leg. °· Your back pain developed after you fell. °· You develop pain on one side of your back. You may have a kidney stone. °· You see blood in your urine. You may have a bladder infection or kidney stone. °· You have back pain with   blisters. You may have shingles. °Back pain is fairly common during pregnancy but should not be accepted as just part of the process. Back pain should always be treated as soon as possible. This will make your pregnancy as pleasant as possible. °  °This information is not intended to replace advice given to you by your health care provider. Make sure you discuss any questions you have with your health care provider. °  °Document Released: 11/23/2005 Document Revised: 11/07/2011 Document Reviewed: 01/04/2011 °Elsevier Interactive Patient Education ©2016 Elsevier Inc. ° ° °

## 2016-05-31 LAB — CULTURE, OB URINE

## 2016-05-31 LAB — GC/CHLAMYDIA PROBE AMP (~~LOC~~) NOT AT ARMC
Chlamydia: NEGATIVE
Neisseria Gonorrhea: NEGATIVE

## 2016-05-31 NOTE — Telephone Encounter (Signed)
Just FYI.

## 2016-06-02 ENCOUNTER — Other Ambulatory Visit: Payer: BC Managed Care – PPO

## 2016-06-02 ENCOUNTER — Encounter: Payer: Self-pay | Admitting: Advanced Practice Midwife

## 2016-06-02 ENCOUNTER — Ambulatory Visit (INDEPENDENT_AMBULATORY_CARE_PROVIDER_SITE_OTHER): Payer: BC Managed Care – PPO | Admitting: Advanced Practice Midwife

## 2016-06-02 VITALS — BP 110/78 | HR 76 | Wt 165.0 lb

## 2016-06-02 DIAGNOSIS — Z3482 Encounter for supervision of other normal pregnancy, second trimester: Secondary | ICD-10-CM

## 2016-06-02 DIAGNOSIS — Z331 Pregnant state, incidental: Secondary | ICD-10-CM

## 2016-06-02 DIAGNOSIS — Z1389 Encounter for screening for other disorder: Secondary | ICD-10-CM

## 2016-06-02 DIAGNOSIS — Z23 Encounter for immunization: Secondary | ICD-10-CM

## 2016-06-02 DIAGNOSIS — Z131 Encounter for screening for diabetes mellitus: Secondary | ICD-10-CM

## 2016-06-02 LAB — POCT URINALYSIS DIPSTICK
GLUCOSE UA: NEGATIVE
Ketones, UA: NEGATIVE
LEUKOCYTES UA: NEGATIVE
NITRITE UA: NEGATIVE
Protein, UA: NEGATIVE
RBC UA: NEGATIVE

## 2016-06-02 NOTE — Patient Instructions (Signed)

## 2016-06-02 NOTE — Progress Notes (Signed)
[id number] Estimated Date of Delivery: 08/24/16  Blood pressure 110/78, pulse 76, weight 165 lb (74.8 kg), last menstrual period 11/11/2015, not currently breastfeeding.   BP weight and urine results all reviewed and noted.  Please refer to the obstetrical flow sheet for the fundal height and fetal heart rate documentation:  Patient reports good fetal movement, denies any bleeding and no rupture of membranes symptoms or regular contractions. Patient is without complaints. All questions were answered.  Orders Placed This Encounter  Procedures  . POCT urinalysis dipstick    Plan:  Continued routine obstetrical care, Pn2 today   Return in about 3 weeks (around 06/23/2016) for LROB.

## 2016-06-03 ENCOUNTER — Telehealth: Payer: Self-pay | Admitting: Advanced Practice Midwife

## 2016-06-03 LAB — CBC
HEMATOCRIT: 33.5 % — AB (ref 34.0–46.6)
HEMOGLOBIN: 11.3 g/dL (ref 11.1–15.9)
MCH: 30.8 pg (ref 26.6–33.0)
MCHC: 33.7 g/dL (ref 31.5–35.7)
MCV: 91 fL (ref 79–97)
Platelets: 222 10*3/uL (ref 150–379)
RBC: 3.67 x10E6/uL — AB (ref 3.77–5.28)
RDW: 12.9 % (ref 12.3–15.4)
WBC: 7.7 10*3/uL (ref 3.4–10.8)

## 2016-06-03 LAB — GLUCOSE TOLERANCE, 2 HOURS W/ 1HR
GLUCOSE, 2 HOUR: 86 mg/dL (ref 65–152)
GLUCOSE, FASTING: 77 mg/dL (ref 65–91)
Glucose, 1 hour: 101 mg/dL (ref 65–179)

## 2016-06-03 LAB — ANTIBODY SCREEN: ANTIBODY SCREEN: NEGATIVE

## 2016-06-03 LAB — RPR: RPR Ser Ql: NONREACTIVE

## 2016-06-03 LAB — HIV ANTIBODY (ROUTINE TESTING W REFLEX): HIV SCREEN 4TH GENERATION: NONREACTIVE

## 2016-06-03 NOTE — Telephone Encounter (Signed)
Left message x 1. JSY 

## 2016-06-03 NOTE — Telephone Encounter (Signed)
Pt called stating that she would like for a nurse to give her a call back regarding her blood work. Please contact pt

## 2016-06-06 NOTE — Telephone Encounter (Signed)
Pt called wanting results of her sugar test. Pt informed results were normal. JSY

## 2016-06-13 ENCOUNTER — Telehealth: Payer: Self-pay | Admitting: *Deleted

## 2016-06-13 NOTE — Telephone Encounter (Signed)
Patient called stating she is having unbearable shooting pain from vagina to groin to bladder with urinary frequency and unable to walk at times.  She is not contracting, leaking any fluid or bleeding. Baby is active. She has tried Tylenol, hot baths and stretches but nothing is helping. Please advise. Patient states you may mychart her with response.

## 2016-06-13 NOTE — Telephone Encounter (Signed)
Spoke with patient and made her aware that Selena BattenKim wants her to be seen in the office. Patient states she cannot come today but can try to make appt for in the morning. [facility]

## 2016-06-14 ENCOUNTER — Encounter: Payer: BC Managed Care – PPO | Admitting: Women's Health

## 2016-06-15 ENCOUNTER — Encounter: Payer: BC Managed Care – PPO | Admitting: Women's Health

## 2016-06-16 ENCOUNTER — Encounter: Payer: BC Managed Care – PPO | Admitting: Women's Health

## 2016-06-20 ENCOUNTER — Telehealth: Payer: Self-pay | Admitting: Women's Health

## 2016-06-20 NOTE — Telephone Encounter (Signed)
Pt c/o abdominal pain, cramping, contractions, decreased FM, blood noticed when she wiped this am, thinks she lost her mucus plug.  Pt informed per Dr. Despina HiddenEure to go to [facility] since it is after hours.

## 2016-06-23 ENCOUNTER — Encounter: Payer: Self-pay | Admitting: Women's Health

## 2016-06-23 ENCOUNTER — Ambulatory Visit (INDEPENDENT_AMBULATORY_CARE_PROVIDER_SITE_OTHER): Payer: BC Managed Care – PPO | Admitting: Women's Health

## 2016-06-23 VITALS — BP 120/80 | HR 92 | Wt 165.0 lb

## 2016-06-23 DIAGNOSIS — Z1389 Encounter for screening for other disorder: Secondary | ICD-10-CM

## 2016-06-23 DIAGNOSIS — O368130 Decreased fetal movements, third trimester, not applicable or unspecified: Secondary | ICD-10-CM

## 2016-06-23 DIAGNOSIS — Z3483 Encounter for supervision of other normal pregnancy, third trimester: Secondary | ICD-10-CM

## 2016-06-23 DIAGNOSIS — Z331 Pregnant state, incidental: Secondary | ICD-10-CM

## 2016-06-23 DIAGNOSIS — O26893 Other specified pregnancy related conditions, third trimester: Secondary | ICD-10-CM

## 2016-06-23 DIAGNOSIS — N898 Other specified noninflammatory disorders of vagina: Secondary | ICD-10-CM

## 2016-06-23 DIAGNOSIS — Z3A31 31 weeks gestation of pregnancy: Secondary | ICD-10-CM

## 2016-06-23 LAB — POCT URINALYSIS DIPSTICK
Blood, UA: NEGATIVE
Glucose, UA: NEGATIVE
KETONES UA: NEGATIVE
LEUKOCYTES UA: NEGATIVE
Nitrite, UA: NEGATIVE
PROTEIN UA: NEGATIVE

## 2016-06-23 LAB — POCT WET PREP (WET MOUNT)
CLUE CELLS WET PREP WHIFF POC: NEGATIVE
Trichomonas Wet Prep HPF POC: ABSENT

## 2016-06-23 MED ORDER — OMEPRAZOLE 20 MG PO CPDR: 20.0000 mg | DELAYED_RELEASE_CAPSULE | Freq: Every day | ORAL | 1 refills | Status: AC

## 2016-06-23 NOTE — Patient Instructions (Signed)
Tips to Help You Sleep Better:   Get into a bedtime routine, try to do the same thing every night before going to bed to try to help your body wind down  Warm baths  Avoid caffeine for at least 3 hours before going to sleep   Keep your room at a slightly cooler temperature, can try running a fan  Turn off TV, lights, phone, electronics  Lots of pillows if needed to help you get comfortable  Lavender scented items can help you sleep. You can place lavender essential oil on a cotton ball and place under your pillowcase, or place in a diffuser. Febreeze has a lavender scented sleep line (plug-ins, sprays, etc). Look in the pillow aisle for lavender scented pillows.   If none of the above things help, you can try 1/2 to 1 tablet of benadryl, unisom, or tylenol pm. Do not take this every night, only when you really need it.    Call the office ([phone]) or go to [facility] if:  You begin to have strong, frequent contractions  Your water breaks.  Sometimes it is a big gush of fluid, sometimes it is just a trickle that keeps getting your panties wet or running down your legs  You have vaginal bleeding.  It is normal to have a small amount of spotting if your cervix was checked.   You don't feel your baby moving like normal.  If you don't, get you something to eat and drink and lay down and focus on feeling your baby move.  You should feel at least 10 movements in 2 hours.  If you don't, you should call the office or go to [facility].    Preterm Labor Information Preterm labor is when labor starts at less than 37 weeks of pregnancy. The normal length of a pregnancy is 39 to 41 weeks. CAUSES Often, there is no identifiable underlying cause as to why a woman goes into preterm labor. One of the most common known causes of preterm labor is infection. Infections of the uterus, cervix, vagina, amniotic sac, bladder, kidney, or even the lungs (pneumonia) can cause labor to start. Other  suspected causes of preterm labor include:   Urogenital infections, such as yeast infections and bacterial vaginosis.   Uterine abnormalities (uterine shape, uterine septum, fibroids, or bleeding from the placenta).   A cervix that has been operated on (it may fail to stay closed).   Malformations in the fetus.   Multiple gestations (twins, triplets, and so on).   Breakage of the amniotic sac.  RISK FACTORS  Having a previous history of preterm labor.   Having premature rupture of membranes (PROM).   Having a placenta that covers the opening of the cervix (placenta previa).   Having a placenta that separates from the uterus (placental abruption).   Having a cervix that is too weak to hold the fetus in the uterus (incompetent cervix).   Having too much fluid in the amniotic sac (polyhydramnios).   Taking illegal drugs or smoking while pregnant.   Not gaining enough weight while pregnant.   Being younger than 18 and older than 22 years old.   Having a low socioeconomic status.   Being African American. SYMPTOMS Signs and symptoms of preterm labor include:   Menstrual-like cramps, abdominal pain, or back pain.  Uterine contractions that are regular, as frequent as six in an hour, regardless of their intensity (may be mild or painful).  Contractions that start on the top   of the uterus and spread down to the lower abdomen and back.   A sense of increased pelvic pressure.   A watery or bloody mucus discharge that comes from the vagina.  TREATMENT Depending on the length of the pregnancy and other circumstances, your health care provider may suggest bed rest. If necessary, there are medicines that can be given to stop contractions and to mature the fetal lungs. If labor happens before 34 weeks of pregnancy, a prolonged hospital stay may be recommended. Treatment depends on the condition of both you and the fetus.  WHAT SHOULD YOU DO IF YOU THINK YOU ARE  IN PRETERM LABOR? Call your health care provider right away. You will need to go to the hospital to get checked immediately. HOW CAN YOU PREVENT PRETERM LABOR IN FUTURE PREGNANCIES? You should:   Stop smoking if you smoke.  Maintain healthy weight gain and avoid chemicals and drugs that are not necessary.  Be watchful for any type of infection.  Inform your health care provider if you have a known history of preterm labor.   This information is not intended to replace advice given to you by your health care provider. Make sure you discuss any questions you have with your health care provider.   Document Released: 11/05/2003 Document Revised: 04/17/2013 Document Reviewed: 09/17/2012 Elsevier Interactive Patient Education 2016 Elsevier Inc.  

## 2016-06-23 NOTE — Progress Notes (Signed)
Low-risk OB appointment [id number] Estimated Date of Delivery: 08/24/16 BP 120/80   Pulse 92   Wt 165 lb (74.8 kg)   LMP 11/11/2015   BMI 29.23 kg/m   BP, weight, and urine reviewed.  Refer to obstetrical flow sheet for FH & FHR.  Reports decreased fm.  Denies lof, vb, or uti s/s. Multiple complaints- contractions, back pain, hips popping, lost mucous plug, trouble sleeping, decreased fm, abnormal d/c.  Spec exam: cx visually closed, fFN collected SVE: outer os 1-2/inner os closed/thick/ballotable, fFN not sent Wet prep neg NST reactive for GA +10x10s/Cat 1, no uc's Reviewed pn2 results, ptl s/s, fkc. Plan:  Continue routine obstetrical care  F/U in 2wks for OB appointment

## 2016-07-04 ENCOUNTER — Telehealth: Payer: Self-pay | Admitting: Women's Health

## 2016-07-05 ENCOUNTER — Telehealth: Payer: Self-pay | Admitting: *Deleted

## 2016-07-06 NOTE — Telephone Encounter (Signed)
Pt spoke with Drenda FreezeFran on 07/05/16.

## 2016-07-06 NOTE — Telephone Encounter (Signed)
Talked w/ pt and she will come in Thursday.  If ctx are <10 minutes apart, come in

## 2016-07-07 ENCOUNTER — Ambulatory Visit (INDEPENDENT_AMBULATORY_CARE_PROVIDER_SITE_OTHER): Payer: BC Managed Care – PPO | Admitting: Women's Health

## 2016-07-07 ENCOUNTER — Encounter: Payer: Self-pay | Admitting: Women's Health

## 2016-07-07 VITALS — BP 108/58 | HR 106 | Wt 164.0 lb

## 2016-07-07 DIAGNOSIS — Z8659 Personal history of other mental and behavioral disorders: Secondary | ICD-10-CM

## 2016-07-07 DIAGNOSIS — O26893 Other specified pregnancy related conditions, third trimester: Secondary | ICD-10-CM | POA: Diagnosis not present

## 2016-07-07 DIAGNOSIS — Z331 Pregnant state, incidental: Secondary | ICD-10-CM

## 2016-07-07 DIAGNOSIS — O99891 Other specified diseases and conditions complicating pregnancy: Secondary | ICD-10-CM

## 2016-07-07 DIAGNOSIS — Z3A33 33 weeks gestation of pregnancy: Secondary | ICD-10-CM

## 2016-07-07 DIAGNOSIS — N898 Other specified noninflammatory disorders of vagina: Secondary | ICD-10-CM

## 2016-07-07 DIAGNOSIS — Z1389 Encounter for screening for other disorder: Secondary | ICD-10-CM

## 2016-07-07 DIAGNOSIS — O9989 Other specified diseases and conditions complicating pregnancy, childbirth and the puerperium: Secondary | ICD-10-CM

## 2016-07-07 DIAGNOSIS — Z3483 Encounter for supervision of other normal pregnancy, third trimester: Secondary | ICD-10-CM

## 2016-07-07 DIAGNOSIS — H6691 Otitis media, unspecified, right ear: Secondary | ICD-10-CM

## 2016-07-07 LAB — POCT URINALYSIS DIPSTICK
GLUCOSE UA: NEGATIVE
Ketones, UA: NEGATIVE
LEUKOCYTES UA: NEGATIVE
NITRITE UA: NEGATIVE
Protein, UA: NEGATIVE
RBC UA: NEGATIVE

## 2016-07-07 LAB — POCT WET PREP (WET MOUNT)
Clue Cells Wet Prep Whiff POC: NEGATIVE
TRICHOMONAS WET PREP HPF POC: ABSENT

## 2016-07-07 MED ORDER — AMOXICILLIN 500 MG PO CAPS: 500.0000 mg | ORAL_CAPSULE | Freq: Two times a day (BID) | ORAL | 0 refills | Status: DC

## 2016-07-07 NOTE — Patient Instructions (Addendum)
Over the counter monistat 7 for yeast  Call the office [phone]) or [facility]:  You begin to have strong, frequent contractions  Your water breaks.  Sometimes it is a big gush of fluid, sometimes it is just a trickle that keeps getting your panties wet or running down your legs  You have vaginal bleeding.  It is normal to have a small amount of spotting if your cervix was checked.   You don't feel your baby moving like normal.  If you don't, get you something to eat and drink and lay down and focus on feeling your baby move.  You should feel at least 10 movements in 2 hours.  If you don't, you should call the office or [facility].   Preterm Labor Information Preterm labor is when labor starts at less than 37 weeks of pregnancy. The normal length of a pregnancy is 39 to 41 weeks. CAUSES Often, there is no identifiable underlying cause as to why a woman goes into preterm labor. One of the most common known causes of preterm labor is infection. Infections of the uterus, cervix, vagina, amniotic sac, bladder, kidney, or even the lungs (pneumonia) can cause labor to start. Other suspected causes of preterm labor include:   Urogenital infections, such as yeast infections and bacterial vaginosis.   Uterine abnormalities (uterine shape, uterine septum, fibroids, or bleeding from the placenta).   A cervix that has been operated on (it may fail to stay closed).   Malformations in the fetus.   Multiple gestations (twins, triplets, and so on).   Breakage of the amniotic sac.  RISK FACTORS  Having a previous history of preterm labor.   Having premature rupture of membranes (PROM).   Having a placenta that covers the opening of the cervix (placenta previa).   Having a placenta that separates from the uterus (placental abruption).   Having a cervix that is too weak to hold the fetus in the uterus (incompetent cervix).   Having too much fluid in the  amniotic sac (polyhydramnios).   Taking illegal drugs or smoking while pregnant.   Not gaining enough weight while pregnant.   Being younger than 8618 and older than 22 years old.   Having a low socioeconomic status.   Being African American. SYMPTOMS Signs and symptoms of preterm labor include:   Menstrual-like cramps, abdominal pain, or back pain.  Uterine contractions that are regular, as frequent as six in an hour, regardless of their intensity (may be mild or painful).  Contractions that start on the top of the uterus and spread down to the lower abdomen and back.   A sense of increased pelvic pressure.   A watery or bloody mucus discharge that comes from the vagina.  TREATMENT Depending on the length of the pregnancy and other circumstances, your health care provider may suggest bed rest. If necessary, there are medicines that can be given to stop contractions and to mature the fetal lungs. If labor happens before 34 weeks of pregnancy, a prolonged hospital stay may be recommended. Treatment depends on the condition of both you and the fetus.  WHAT SHOULD YOU DO IF YOU THINK YOU ARE IN PRETERM LABOR? Call your health care provider right away. You will need to go to the hospital to get checked immediately. HOW CAN YOU PREVENT PRETERM LABOR IN FUTURE PREGNANCIES? You should:   Stop smoking if you smoke.  Maintain healthy weight gain and avoid chemicals and drugs that  are not necessary.  Be watchful for any type of infection.  Inform your health care provider if you have a known history of preterm labor.   This information is not intended to replace advice given to you by your health care provider. Make sure you discuss any questions you have with your health care provider.   Document Released: 11/05/2003 Document Revised: 04/17/2013 Document Reviewed: 09/17/2012 Elsevier Interactive Patient Education [employer] Else[employer] Inc.

## 2016-07-07 NOTE — Progress Notes (Signed)
Low-risk OB appointment [id number] Estimated Date of Delivery: 08/24/16 BP (!) 108/58   Pulse (!) 106   Wt 164 lb (74.4 kg)   LMP 11/11/2015   BMI 29.05 kg/m   BP, weight, and urine reviewed.  Refer to obstetrical flow sheet for FH & FHR.  Reports good fm.  Denies regular uc's, lof, vb, or uti s/s. Multiple complaints.  Rt ear and down into Rt neck hurting for few days, no other sx: Rt ear canal erythematous, +fluid behind TM, rx amoxicillin bid x 7d [place] uc's [place] [employer], had to go back to car and sit down. Vulvar swelling and sat down on toilet and felt something tear- looked w/ mirror and had blood coming from Lt labia minora area. Has been alternating ice/heat for swelling and helps a lot. 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 Eats well, not sleeping well, still finds joy in things she used to. Denies SI. Declines counseling/meds.  Reviewed ptl s/s, fkc. Plan:  Continue routine obstetrical care  F/U in 2wks for OB appointment

## 2016-07-10 ENCOUNTER — Other Ambulatory Visit: Payer: Self-pay | Admitting: Advanced Practice Midwife

## 2016-07-20 ENCOUNTER — Ambulatory Visit (INDEPENDENT_AMBULATORY_CARE_PROVIDER_SITE_OTHER): Payer: BC Managed Care – PPO | Admitting: Obstetrics and Gynecology

## 2016-07-20 ENCOUNTER — Encounter: Payer: Self-pay | Admitting: Obstetrics and Gynecology

## 2016-07-20 ENCOUNTER — Encounter: Payer: BC Managed Care – PPO | Admitting: Obstetrics and Gynecology

## 2016-07-20 DIAGNOSIS — Z3483 Encounter for supervision of other normal pregnancy, third trimester: Secondary | ICD-10-CM

## 2016-07-20 DIAGNOSIS — Z331 Pregnant state, incidental: Secondary | ICD-10-CM

## 2016-07-20 DIAGNOSIS — Z3A35 35 weeks gestation of pregnancy: Secondary | ICD-10-CM

## 2016-07-20 DIAGNOSIS — Z1389 Encounter for screening for other disorder: Secondary | ICD-10-CM

## 2016-07-20 LAB — POCT URINALYSIS DIPSTICK
Blood, UA: NEGATIVE
Glucose, UA: NEGATIVE
Ketones, UA: NEGATIVE
Leukocytes, UA: NEGATIVE
NITRITE UA: NEGATIVE
Protein, UA: NEGATIVE

## 2016-07-20 NOTE — Progress Notes (Signed)
Patient ID: Kristina Robbins, female   DOB: 01-14-1996, 20 y.o.   MRN: [id number]  [id number]  Estimated Date of Delivery: 08/24/16 LROB 447w0d  Last menstrual period 11/11/2015, not currently breastfeeding.    Urine results: notable for none  Chief Complaint  Patient presents with  . Routine Prenatal Visit    having a lot of pressure, lbp, and contractions off and on X 2 wks    Patient complaints: Lower abdominal pain and pressure. Pt states her pain is worsened with long periods of standing. She states her pain is not alleviated with a pregnancy belt.   She states she would like to use ParaGard for birth control after delivery.   Patient reports good fetal movement. She denies any bleeding, rupture of membranes, or regular contractions.   Refer to the ob flow sheet for FH and FHR.    Physical Examination: General appearance - alert, well appearing, and in no distress                                      Abdomen - FH 35 cm,                                                         -FHR 132 bpm                                                         soft, nontender, nondistended, no masses or organomegaly, fetus vertex                                       Pelvic - VULVA: normal appearing vulva with no masses, tenderness or lesions,      VAGINA: normal appearing vagina with normal color and discharge, no lesions,      CERVIX: normal appearing cervix without discharge or lesions, closed, posterior                                             Questions were answered. Assessment: LROB [id number] @ 6147w0d  Advised pt of LARC options   Plan:  Continued routine obstetrical care  F/u in 2 weeks for routine prenatal care    By signing my name below, I, Doreatha MartinEva Mathews, attest that this documentation has been prepared under the direction and in the presence of Tilda BurrowJohn V Jamarious Febo, MD. Electronically Signed: Doreatha MartinEva Mathews, ED Scribe. 07/20/16. 4:11 PM.  I personally performed the services described in  this documentation, which was SCRIBED in my presence. The recorded information has been reviewed and considered accurate. It has been edited as necessary during review. Tilda BurrowFERGUSON,Jahlil Ziller V, MD

## 2016-07-26 ENCOUNTER — Encounter: Payer: Self-pay | Admitting: Obstetrics and Gynecology

## 2016-07-27 ENCOUNTER — Telehealth: Payer: Self-pay | Admitting: *Deleted

## 2016-07-27 ENCOUNTER — Encounter: Payer: Self-pay | Admitting: Women's Health

## 2016-07-27 NOTE — Telephone Encounter (Signed)
Spoke with pt. Pt wonders if she is engorged. Breasts are huge and leaking. FCD, CNM spoke with pt and advised to use ice packs. Pt reassured. Pt voiced understanding. JSY

## 2016-08-01 ENCOUNTER — Ambulatory Visit (INDEPENDENT_AMBULATORY_CARE_PROVIDER_SITE_OTHER): Payer: BC Managed Care – PPO | Admitting: Women's Health

## 2016-08-01 ENCOUNTER — Encounter: Payer: Self-pay | Admitting: Women's Health

## 2016-08-01 VITALS — BP 92/54 | HR 84 | Wt 167.0 lb

## 2016-08-01 DIAGNOSIS — Z3483 Encounter for supervision of other normal pregnancy, third trimester: Secondary | ICD-10-CM

## 2016-08-01 DIAGNOSIS — Z1389 Encounter for screening for other disorder: Secondary | ICD-10-CM

## 2016-08-01 DIAGNOSIS — Z331 Pregnant state, incidental: Secondary | ICD-10-CM

## 2016-08-01 DIAGNOSIS — Z3A37 37 weeks gestation of pregnancy: Secondary | ICD-10-CM

## 2016-08-01 NOTE — Progress Notes (Signed)
Low-risk OB appointment [id number] 6877w5d Estimated Date of Delivery: 08/24/16 BP (!) 92/54   Pulse 84   Wt 167 lb (75.8 kg)   LMP 11/11/2015   BMI 29.58 kg/m   BP, weight, and urine reviewed.  Refer to obstetrical flow sheet for FH & FHR.  Reports good fm.  Denies regular uc's, lof, vb, or uti s/s.Headache since yesterday, feet swollen yesterday. BP great, no proteinuria. No swelling today. DTRs 2+, no clonus.  GBS, gc/ct collected SVE per request: 1.5/th/-2, vtx Reviewed labor s/s, fkc. Plan:  Continue routine obstetrical care  F/U in 1wk for OB appointment

## 2016-08-01 NOTE — Patient Instructions (Addendum)
Call the office [phone]) or go to [facility] if:  You begin to have strong, frequent contractions  Your water breaks.  Sometimes it is a big gush of fluid, sometimes it is just a trickle that keeps getting your panties wet or running down your legs  You have vaginal bleeding.  It is normal to have a small amount of spotting if your cervix was checked.   You don't feel your baby moving like normal.  If you don't, get you something to eat and drink and lay down and focus on feeling your baby move.  You should feel at least 10 movements in 2 hours.  If you don't, you should call the office or [facility].    For Headaches:   Stay well hydrated, drink enough water so that your urine is clear, sometimes if you are dehydrated you can get headaches  Eat small frequent meals and snacks, sometimes if you are hungry you can get headaches  Sometimes you get headaches during pregnancy from the pregnancy hormones  You can try tylenol (1-2 regular strength 325mg  or 1-2 extra strength 500mg ) as directed on the box. The least amount of medication that works is best.   Cool compresses (cool wet washcloth or ice pack) to area of head that is hurting  You can also try drinking a caffeinated drink to see if this will help  If not helping, try below:  For Prevention of Headaches/Migraines:  CoQ10 100mg  three times daily  Vitamin B2 400mg  daily  Magnesium Oxide 400-600mg  daily  If You Get a Bad Headache/Migraine:  Benadryl 25mg    Magnesium Oxide  1 large Gatorade  2 extra strength Tylenol (1,000mg  total)  1 cup coffee or Coke  If this doesn't help please call us @ [phone]   [facility] Contractions of the uterus can occur throughout pregnancy. Contractions are not always a sign that you are in labor.  WHAT ARE BRAXTON HICKS CONTRACTIONS?  Contractions that occur before labor are called Braxton Hicks contractions, or false labor. Toward the end of  pregnancy (32-34 weeks), these contractions can develop more often and may become more forceful. This is not true labor because these contractions do not result in opening (dilatation) and thinning of the cervix. They are sometimes difficult to tell apart from true labor because these contractions can be forceful and people have different pain tolerances. You should not feel embarrassed if you go to the hospital with false labor. Sometimes, the only way to tell if you are in true labor is for your health care provider to look for changes in the cervix. If there are no prenatal problems or other health problems associated with the pregnancy, it is completely safe to be sent home with false labor and await the onset of true labor. HOW CAN YOU TELL THE DIFFERENCE BETWEEN TRUE AND FALSE LABOR? False Labor   The contractions of false labor are usually shorter and not as hard as those of true labor.   The contractions are usually irregular.   The contractions are often felt in the front of the lower abdomen and in the groin.   The contractions may go away when you walk around or change positions while lying down.   The contractions get weaker and are shorter lasting as time goes on.   The contractions do not usually become progressively stronger, regular, and closer together as with true labor.  True Labor   Contractions in true labor last 30-70  seconds, become very regular, usually become more intense, and increase in frequency.   The contractions do not go away with walking.   The discomfort is usually felt in the top of the uterus and spreads to the lower abdomen and low back.   True labor can be determined by your health care provider with an exam. This will show that the cervix is dilating and getting thinner.  WHAT TO REMEMBER  Keep up with your usual exercises and follow other instructions given by your health care provider.   Take medicines as directed by your health care  provider.   Keep your regular prenatal appointments.   Eat and drink lightly if you think you are going into labor.   If Braxton Hicks contractions are making you uncomfortable:   Change your position from lying down or resting to walking, or from walking to resting.   Sit and rest in a tub of warm water.   Drink 2-3 glasses of water. Dehydration may cause these contractions.   Do slow and deep breathing several times an hour.  WHEN SHOULD I SEEK IMMEDIATE MEDICAL CARE? Seek immediate medical care if:  Your contractions become stronger, more regular, and closer together.   You have fluid leaking or gushing from your vagina.   You have a fever.   You pass blood-tinged mucus.   You have vaginal bleeding.   You have continuous abdominal pain.   You have low back pain that you never had before.   You feel your baby's head pushing down and causing pelvic pressure.   Your baby is not moving as much as it used to.  This information is not intended to replace advice given to you by your health care provider. Make sure you discuss any questions you have with your health care provider. Document Released: 08/15/2005 Document Revised: 12/07/2015 Document Reviewed: 05/27/2013 Elsevier Interactive Patient Education  2017 [employer]

## 2016-08-03 ENCOUNTER — Encounter (HOSPITAL_COMMUNITY): Payer: Self-pay

## 2016-08-03 ENCOUNTER — Inpatient Hospital Stay (HOSPITAL_COMMUNITY)
Admission: AD | Admit: 2016-08-03 | Discharge: 2016-08-03 | Disposition: A | Discharge status: Home / Self Care | Payer: Medicaid Other | Source: Ambulatory Visit | Attending: Obstetrics and Gynecology | Admitting: Obstetrics and Gynecology

## 2016-08-03 ENCOUNTER — Telehealth: Payer: Self-pay | Admitting: *Deleted

## 2016-08-03 DIAGNOSIS — Z3490 Encounter for supervision of normal pregnancy, unspecified, unspecified trimester: Secondary | ICD-10-CM | POA: Diagnosis not present

## 2016-08-03 DIAGNOSIS — Z3A00 Weeks of gestation of pregnancy not specified: Secondary | ICD-10-CM | POA: Diagnosis not present

## 2016-08-03 NOTE — Telephone Encounter (Signed)
Pt c/o contractions 5 minutes since noon, becoming more intense. Per Cyril MourningJennifer Griffin, NP [facility] Pt verbalized understanding.

## 2016-08-03 NOTE — MAU Note (Signed)
Pt reports contractions since 12noon, denies bleeding or ROM.

## 2016-08-04 ENCOUNTER — Telehealth: Payer: Self-pay | Admitting: *Deleted

## 2016-08-04 LAB — OB RESULTS CONSOLE GBS: GBS: NEGATIVE

## 2016-08-04 LAB — GC/CHLAMYDIA PROBE AMP
Chlamydia trachomatis, NAA: NEGATIVE
NEISSERIA GONORRHOEAE BY PCR: NEGATIVE

## 2016-08-04 LAB — STREP GP B NAA: STREP GROUP B AG: NEGATIVE

## 2016-08-04 NOTE — Telephone Encounter (Signed)
[facility] called [facility] and was sent home. She was 2.5cm with contractions every 2 minutes. She states that her contractions have since slowed down and are not as painful but is still contracting every 5 minutes. She wanted to know if she could or needed to be checked. She was talking through her contractions throughout my conversation with her.  I spot with Genella RifeK. Booker, CNM who stated that if she was talking through contraction, baby was moving 10 movements in 2 hours and not leaking fluid then she was fine not to be examined. I informed patient that she was probably in the early stages of labor and contraction strength may vary. If she started breathing through her contractions, bleeding or leaking fluid to give us a call back or [facility]. Pt verbalized understanding.

## 2016-08-07 ENCOUNTER — Encounter (HOSPITAL_COMMUNITY): Payer: Self-pay | Admitting: Certified Nurse Midwife

## 2016-08-07 ENCOUNTER — Inpatient Hospital Stay (HOSPITAL_COMMUNITY)
Admission: AD | Admit: 2016-08-07 | Discharge: 2016-08-07 | Disposition: A | Discharge status: Home / Self Care | Payer: Medicaid Other | Source: Ambulatory Visit | Attending: Obstetrics & Gynecology | Admitting: Obstetrics & Gynecology

## 2016-08-07 DIAGNOSIS — Z3A00 Weeks of gestation of pregnancy not specified: Secondary | ICD-10-CM | POA: Insufficient documentation

## 2016-08-07 DIAGNOSIS — Z3490 Encounter for supervision of normal pregnancy, unspecified, unspecified trimester: Secondary | ICD-10-CM | POA: Insufficient documentation

## 2016-08-07 NOTE — Discharge Instructions (Signed)

## 2016-08-07 NOTE — MAU Note (Signed)
Patient presents to MAU with complaints of contractions. Patient states contractions began around 0130 and has gotten stronger since then. Denies bleeding or leaking of fluid.

## 2016-08-10 ENCOUNTER — Encounter: Payer: BC Managed Care – PPO | Admitting: Women's Health

## 2016-08-10 ENCOUNTER — Encounter: Payer: Self-pay | Admitting: Women's Health

## 2016-08-10 ENCOUNTER — Ambulatory Visit (INDEPENDENT_AMBULATORY_CARE_PROVIDER_SITE_OTHER): Payer: BC Managed Care – PPO | Admitting: Women's Health

## 2016-08-10 VITALS — BP 108/60 | HR 92 | Wt 169.0 lb

## 2016-08-10 DIAGNOSIS — Z3483 Encounter for supervision of other normal pregnancy, third trimester: Secondary | ICD-10-CM

## 2016-08-10 DIAGNOSIS — Z1389 Encounter for screening for other disorder: Secondary | ICD-10-CM

## 2016-08-10 DIAGNOSIS — Z331 Pregnant state, incidental: Secondary | ICD-10-CM

## 2016-08-10 DIAGNOSIS — Z3A38 38 weeks gestation of pregnancy: Secondary | ICD-10-CM

## 2016-08-10 LAB — POCT URINALYSIS DIPSTICK
Glucose, UA: NEGATIVE
KETONES UA: NEGATIVE
Leukocytes, UA: NEGATIVE
Nitrite, UA: NEGATIVE
PROTEIN UA: NEGATIVE
RBC UA: NEGATIVE

## 2016-08-10 NOTE — Progress Notes (Signed)
Low-risk OB appointment [id number] 2646w0d Estimated Date of Delivery: 08/24/16 BP 108/60   Pulse 92   Wt 169 lb (76.7 kg)   LMP 11/11/2015   BMI 29.94 kg/m   BP, weight, and urine reviewed.  Refer to obstetrical flow sheet for FH & FHR.  Reports good fm.  Denies regular uc's, vb, or uti s/s. Leaking watery fluid sun and again today, but feels she's losing mucous plug- so could be that.  SSE: cx visually closed, no pooling, no change w/ valsalva, fern and nitrazine neg SVE per request: 3/50/-2, vtx Wants membrane sweeping next week Reviewed labor s/s, fkc. Plan:  Continue routine obstetrical care  F/U in 1wk for OB appointment

## 2016-08-10 NOTE — Patient Instructions (Signed)
Call the office ([phone]) or go to [facility] if:  You begin to have strong, frequent contractions  Your water breaks.  Sometimes it is a big gush of fluid, sometimes it is just a trickle that keeps getting your panties wet or running down your legs  You have vaginal bleeding.  It is normal to have a small amount of spotting if your cervix was checked.   You don't feel your baby moving like normal.  If you don't, get you something to eat and drink and lay down and focus on feeling your baby move.  You should feel at least 10 movements in 2 hours.  If you don't, you should call the office or go to [facility].     Braxton Hicks Contractions Contractions of the uterus can occur throughout pregnancy. Contractions are not always a sign that you are in labor.  WHAT ARE BRAXTON HICKS CONTRACTIONS?  Contractions that occur before labor are called Braxton Hicks contractions, or false labor. Toward the end of pregnancy (32-34 weeks), these contractions can develop more often and may become more forceful. This is not true labor because these contractions do not result in opening (dilatation) and thinning of the cervix. They are sometimes difficult to tell apart from true labor because these contractions can be forceful and people have different pain tolerances. You should not feel embarrassed if you go to the hospital with false labor. Sometimes, the only way to tell if you are in true labor is for your health care provider to look for changes in the cervix. If there are no prenatal problems or other health problems associated with the pregnancy, it is completely safe to be sent home with false labor and await the onset of true labor. HOW CAN YOU TELL THE DIFFERENCE BETWEEN TRUE AND FALSE LABOR? False Labor   The contractions of false labor are usually shorter and not as hard as those of true labor.   The contractions are usually irregular.   The contractions are often felt in the front of  the lower abdomen and in the groin.   The contractions may go away when you walk around or change positions while lying down.   The contractions get weaker and are shorter lasting as time goes on.   The contractions do not usually become progressively stronger, regular, and closer together as with true labor.  True Labor   Contractions in true labor last 30-70 seconds, become very regular, usually become more intense, and increase in frequency.   The contractions do not go away with walking.   The discomfort is usually felt in the top of the uterus and spreads to the lower abdomen and low back.   True labor can be determined by your health care provider with an exam. This will show that the cervix is dilating and getting thinner.  WHAT TO REMEMBER  Keep up with your usual exercises and follow other instructions given by your health care provider.   Take medicines as directed by your health care provider.   Keep your regular prenatal appointments.   Eat and drink lightly if you think you are going into labor.   If Braxton Hicks contractions are making you uncomfortable:   Change your position from lying down or resting to walking, or from walking to resting.   Sit and rest in a tub of warm water.   Drink 2-3 glasses of water. Dehydration may cause these contractions.   Do slow and deep breathing several   times an hour.  WHEN SHOULD I SEEK IMMEDIATE MEDICAL CARE? Seek immediate medical care if:  Your contractions become stronger, more regular, and closer together.   You have fluid leaking or gushing from your vagina.   You have a fever.   You pass blood-tinged mucus.   You have vaginal bleeding.   You have continuous abdominal pain.   You have low back pain that you never had before.   You feel your baby's head pushing down and causing pelvic pressure.   Your baby is not moving as much as it used to.  This information is not intended to  replace advice given to you by your health care provider. Make sure you discuss any questions you have with your health care provider. Document Released: 08/15/2005 Document Revised: 12/07/2015 Document Reviewed: 05/27/2013 Elsevier Interactive Patient Education  2017 Elsevier Inc.  

## 2016-08-11 ENCOUNTER — Inpatient Hospital Stay (HOSPITAL_COMMUNITY)
Admission: AD | Admit: 2016-08-11 | Discharge: 2016-08-11 | Disposition: A | Discharge status: Home / Self Care | Payer: Medicaid Other | Source: Ambulatory Visit | Attending: Obstetrics and Gynecology | Admitting: Obstetrics and Gynecology

## 2016-08-11 ENCOUNTER — Encounter (HOSPITAL_COMMUNITY): Payer: Self-pay

## 2016-08-11 DIAGNOSIS — Z3A00 Weeks of gestation of pregnancy not specified: Secondary | ICD-10-CM | POA: Insufficient documentation

## 2016-08-11 DIAGNOSIS — Z3490 Encounter for supervision of normal pregnancy, unspecified, unspecified trimester: Secondary | ICD-10-CM | POA: Insufficient documentation

## 2016-08-11 NOTE — MAU Note (Signed)
Pt reports contractions, bloody show.  

## 2016-08-11 NOTE — Discharge Instructions (Signed)
Braxton Hicks Contractions Contractions of the uterus can occur throughout pregnancy. Contractions are not always a sign that you are in labor.  WHAT ARE BRAXTON HICKS CONTRACTIONS?  Contractions that occur before labor are called Braxton Hicks contractions, or false labor. Toward the end of pregnancy (32-34 weeks), these contractions can develop more often and may become more forceful. This is not true labor because these contractions do not result in opening (dilatation) and thinning of the cervix. They are sometimes difficult to tell apart from true labor because these contractions can be forceful and people have different pain tolerances. You should not feel embarrassed if you go to the hospital with false labor. Sometimes, the only way to tell if you are in true labor is for your health care provider to look for changes in the cervix. If there are no prenatal problems or other health problems associated with the pregnancy, it is completely safe to be sent home with false labor and await the onset of true labor. HOW CAN YOU TELL THE DIFFERENCE BETWEEN TRUE AND FALSE LABOR? False Labor   The contractions of false labor are usually shorter and not as hard as those of true labor.   The contractions are usually irregular.   The contractions are often felt in the front of the lower abdomen and in the groin.   The contractions may go away when you walk around or change positions while lying down.   The contractions get weaker and are shorter lasting as time goes on.   The contractions do not usually become progressively stronger, regular, and closer together as with true labor.  True Labor   Contractions in true labor last 30-70 seconds, become very regular, usually become more intense, and increase in frequency.   The contractions do not go away with walking.   The discomfort is usually felt in the top of the uterus and spreads to the lower abdomen and low back.   True labor can be  determined by your health care provider with an exam. This will show that the cervix is dilating and getting thinner.  WHAT TO REMEMBER  Keep up with your usual exercises and follow other instructions given by your health care provider.   Take medicines as directed by your health care provider.   Keep your regular prenatal appointments.   Eat and drink lightly if you think you are going into labor.   If Braxton Hicks contractions are making you uncomfortable:   Change your position from lying down or resting to walking, or from walking to resting.   Sit and rest in a tub of warm water.   Drink 2-3 glasses of water. Dehydration may cause these contractions.   Do slow and deep breathing several times an hour.  WHEN SHOULD I SEEK IMMEDIATE MEDICAL CARE? Seek immediate medical care if:  Your contractions become stronger, more regular, and closer together.   You have fluid leaking or gushing from your vagina.   You have a fever.   You pass blood-tinged mucus.   You have vaginal bleeding.   You have continuous abdominal pain.   You have low back pain that you never had before.   You feel your baby's head pushing down and causing pelvic pressure.   Your baby is not moving as much as it used to.  This information is not intended to replace advice given to you by your health care provider. Make sure you discuss any questions you have with your health care   provider. Document Released: 08/15/2005 Document Revised: 12/07/2015 Document Reviewed: 05/27/2013 Elsevier Interactive Patient Education  2017 Else[employer] Inc. Introduction Patient Name: ________________________________________________ Patient Due Date: ____________________ What is a fetal movement count? A fetal movement count is the number of times that you feel your baby move during a certain amount of time. This may also be called a fetal kick count. A fetal movement count is recommended for every pregnant  woman. You may be asked to start counting fetal movements as early as week 28 of your pregnancy. Pay attention to when your baby is most active. You may notice your baby's sleep and wake cycles. You may also notice things that make your baby move more. You should do a fetal movement count:  When your baby is normally most active.  At the same time each day. A good time to count movements is while you are resting, after having something to eat and drink. How do I count fetal movements? 1. Find a quiet, comfortable area. Sit, or lie down on your side. 2. Write down the date, the start time and stop time, and the number of movements that you felt between those two times. Take this information with you to your health care visits. 3. For 2 hours, count kicks, flutters, swishes, rolls, and jabs. You should feel at least 10 movements during 2 hours. 4. You may stop counting after you have felt 10 movements. 5. If you do not feel 10 movements in 2 hours, have something to eat and drink. Then, keep resting and counting for 1 hour. If you feel at least 4 movements during that hour, you may stop counting. Contact a health care provider if:  You feel fewer than 4 movements in 2 hours.  Your baby is not moving like he or she usually does. Date: ____________ Start time: ____________ Stop time: ____________ Movements: ____________ Date: ____________ Start time: ____________ Stop time: ____________ Movements: ____________ Date: ____________ Start time: ____________ Stop time: ____________ Movements: ____________ Date: ____________ Start time: ____________ Stop time: ____________ Movements: ____________ Date: ____________ Start time: ____________ Stop time: ____________ Movements: ____________ Date: ____________ Start time: ____________ Stop time: ____________ Movements: ____________ Date: ____________ Start time: ____________ Stop time: ____________ Movements: ____________ Date: ____________ Start time:  ____________ Stop time: ____________ Movements: ____________ Date: ____________ Start time: ____________ Stop time: ____________ Movements: ____________ This information is not intended to replace advice given to you by your health care provider. Make sure you discuss any questions you have with your health care provider. Document Released: 09/14/2006 Document Revised: 04/13/2016 Document Reviewed: 09/24/2015 Elsevier Interactive Patient Education  2017 [employer]vier Inc.  

## 2016-08-11 NOTE — MAU Note (Signed)
Notified provider that patient is her for a labor eval. Patient is 3/60/-3 which is unchanged after an hour. No problems during the pregnancy Provider said to discharge patient with labor precautions.

## 2016-08-16 ENCOUNTER — Encounter: Payer: BC Managed Care – PPO | Admitting: Obstetrics & Gynecology

## 2016-08-16 ENCOUNTER — Encounter (HOSPITAL_COMMUNITY): Payer: Self-pay | Admitting: *Deleted

## 2016-08-16 ENCOUNTER — Telehealth: Payer: Self-pay | Admitting: *Deleted

## 2016-08-16 ENCOUNTER — Inpatient Hospital Stay (HOSPITAL_COMMUNITY)
Admission: AD | Admit: 2016-08-16 | Discharge: 2016-08-16 | Disposition: A | Discharge status: Home / Self Care | Payer: Medicaid Other | Source: Ambulatory Visit | Attending: Obstetrics and Gynecology | Admitting: Obstetrics and Gynecology

## 2016-08-16 DIAGNOSIS — Z3A38 38 weeks gestation of pregnancy: Secondary | ICD-10-CM | POA: Diagnosis not present

## 2016-08-16 DIAGNOSIS — Z87891 Personal history of nicotine dependence: Secondary | ICD-10-CM | POA: Insufficient documentation

## 2016-08-16 DIAGNOSIS — M549 Dorsalgia, unspecified: Secondary | ICD-10-CM

## 2016-08-16 DIAGNOSIS — O9989 Other specified diseases and conditions complicating pregnancy, childbirth and the puerperium: Secondary | ICD-10-CM

## 2016-08-16 DIAGNOSIS — O26893 Other specified pregnancy related conditions, third trimester: Secondary | ICD-10-CM | POA: Diagnosis not present

## 2016-08-16 DIAGNOSIS — N898 Other specified noninflammatory disorders of vagina: Secondary | ICD-10-CM | POA: Diagnosis not present

## 2016-08-16 LAB — URINALYSIS, ROUTINE W REFLEX MICROSCOPIC
BILIRUBIN URINE: NEGATIVE
Glucose, UA: NEGATIVE mg/dL
Hgb urine dipstick: NEGATIVE
KETONES UR: NEGATIVE mg/dL
Nitrite: NEGATIVE
PH: 6 (ref 5.0–8.0)
PROTEIN: NEGATIVE mg/dL
Specific Gravity, Urine: 1.005 (ref 1.005–1.030)

## 2016-08-16 LAB — POCT FERN TEST: POCT Fern Test: NEGATIVE

## 2016-08-16 NOTE — Discharge Instructions (Signed)
Braxton Hicks Contractions °Contractions of the uterus can occur throughout pregnancy. Contractions are not always a sign that you are in labor.  °WHAT ARE BRAXTON HICKS CONTRACTIONS?  °Contractions that occur before labor are called Braxton Hicks contractions, or false labor. Toward the end of pregnancy (32-34 weeks), these contractions can develop more often and may become more forceful. This is not true labor because these contractions do not result in opening (dilatation) and thinning of the cervix. They are sometimes difficult to tell apart from true labor because these contractions can be forceful and people have different pain tolerances. You should not feel embarrassed if you go to the hospital with false labor. Sometimes, the only way to tell if you are in true labor is for your health care provider to look for changes in the cervix. °If there are no prenatal problems or other health problems associated with the pregnancy, it is completely safe to be sent home with false labor and await the onset of true labor. °HOW CAN YOU TELL THE DIFFERENCE BETWEEN TRUE AND FALSE LABOR? °False Labor  °· The contractions of false labor are usually shorter and not as hard as those of true labor.   °· The contractions are usually irregular.   °· The contractions are often felt in the front of the lower abdomen and in the groin.   °· The contractions may go away when you walk around or change positions while lying down.   °· The contractions get weaker and are shorter lasting as time goes on.   °· The contractions do not usually become progressively stronger, regular, and closer together as with true labor.   °True Labor  °· Contractions in true labor last 30-70 seconds, become very regular, usually become more intense, and increase in frequency.   °· The contractions do not go away with walking.   °· The discomfort is usually felt in the top of the uterus and spreads to the lower abdomen and low back.   °· True labor can be  determined by your health care provider with an exam. This will show that the cervix is dilating and getting thinner.   °WHAT TO REMEMBER °· Keep up with your usual exercises and follow other instructions given by your health care provider.   °· Take medicines as directed by your health care provider.   °· Keep your regular prenatal appointments.   °· Eat and drink lightly if you think you are going into labor.   °· If Braxton Hicks contractions are making you uncomfortable:   °¨ Change your position from lying down or resting to walking, or from walking to resting.   °¨ Sit and rest in a tub of warm water.   °¨ Drink 2-3 glasses of water. Dehydration may cause these contractions.   °¨ Do slow and deep breathing several times an hour.   °WHEN SHOULD I SEEK IMMEDIATE MEDICAL CARE? °Seek immediate medical care if: °· Your contractions become stronger, more regular, and closer together.   °· You have fluid leaking or gushing from your vagina.   °· You have a fever.   °· You pass blood-tinged mucus.   °· You have vaginal bleeding.   °· You have continuous abdominal pain.   °· You have low back pain that you never had before.   °· You feel your baby's head pushing down and causing pelvic pressure.   °· Your baby is not moving as much as it used to.   °This information is not intended to replace advice given to you by your health care provider. Make sure you discuss any questions you have with your health care   provider. °Document Released: 08/15/2005 Document Revised: 12/07/2015 Document Reviewed: 05/27/2013 °Elsevier Interactive Patient Education © 2017 Elsevier Inc. ° °

## 2016-08-16 NOTE — Telephone Encounter (Signed)
Patient called with concerns of back pain, sharp at times since 11:30 last night. She is constantly feeling like she has to move but is talking through the pain. The baby is moving, not leaking any fluid with a small amount of bloody show. I spoke with dr Despina Hiddeneure who stated he did not think the patient was in labor. I advised her to try taking a warm bath, using a heating pad and to monitor her pain. If she continued to have increased bloody show, rupture of membranes or felt like she could not tolerate the pain any longer [facility]. Pt verbalized understanding.

## 2016-08-16 NOTE — MAU Note (Signed)
Pt states that she started to notice some watery discharge around 2300 last night.  Pt states she started having constant cramping starting at 2330.  Pt states that she is having constant pressure and burning when she pees so she wants to be tested for a UTI.

## 2016-08-16 NOTE — MAU Provider Note (Signed)
Chief Complaint:  Labor Eval   First Provider Initiated Contact with Patient 08/16/16 1500     HPI: Kristina Robbins is a 20 y.o. [phone] at 24w6dwho presents to maternity admissions reporting passage for mucous plug for the past week. Might be watery at times. Irregular contractions. Wants to be induced.  States Dr Emelda Fear is stripping her membranes this week. She reports good fetal movement, denies  vaginal bleeding, vaginal itching/burning, urinary symptoms, h/a, dizziness, n/v, diarrhea, constipation or fever/chills.    Vaginal Discharge  The patient's primary symptoms include vaginal discharge. The patient's pertinent negatives include no genital itching, genital lesions, genital odor, pelvic pain or vaginal bleeding. This is a new problem. The current episode started in the past 7 days. The problem occurs intermittently. The problem has been unchanged. The pain is mild. The problem affects both sides. She is pregnant. Pertinent negatives include no chills, fever, hematuria, nausea or vomiting. The vaginal discharge was clear, milky, thick and watery. There has been no bleeding. She has not been passing clots. She has not been passing tissue. Nothing aggravates the symptoms. She has tried nothing for the symptoms.   RN Note: Pt states that she started to notice some watery discharge around 2300 last night.  Pt states she started having constant cramping starting at 2330.  Pt states that she is having constant pressure and burning when she pees so she wants to be tested for a UTI.  Past Medical History: Past Medical History:  Diagnosis Date  . Cystic fibroadenosis of breast   . Irritable bowel syndrome   . Missed period 05/22/2015  . Pityriasis rosea   . Pregnant 12/14/2015    Past obstetric history: OB History  Gravida Para Term Preterm AB Living  4 1 1   2 1   SAB TAB Ectopic Multiple Live Births  2     0 1    # Outcome Date GA Lbr Len/2nd Weight Sex Delivery Anes PTL Lv  4  Current           3 Term 03/06/15 [redacted]w[redacted]d 17:26 / 01:13 7 lb 10.2 oz (3.464 kg) M Vag-Spont EPI N LIV     Birth Comments: none  2 SAB           1 SAB               Past Surgical History: Past Surgical History:  Procedure Laterality Date  . NO PAST SURGERIES      Family History: Family History  Problem Relation Age of Onset  . Cystic fibrosis Mother   . Mental illness Mother   . Depression Mother   . Bipolar disorder Mother   . Paranoid behavior Mother   . Schizophrenia Mother   . Diabetes Mother   . Hypertension Mother   . Autism Brother   . Other Maternal Grandfather     muscle degeneration  . Diabetes Maternal Grandfather   . Hypertension Maternal Grandfather   . Hypertension Maternal Grandmother   . Hypertension Paternal Grandmother   . Cancer Paternal Grandmother     breast  . Hypertension Paternal Grandfather   . Diabetes Paternal Uncle   . Hypertension Paternal Uncle   . Cancer Paternal Aunt     lung    Social History: Social History  Substance Use Topics  . Smoking status: Former Smoker    Packs/day: 0.50    Years: 2.00    Types: Cigarettes  . Smokeless tobacco: Never Used  . Alcohol use  No    Allergies:  Allergies  Allergen Reactions  . Omnicef [Cefdinir] Nausea Only and Other (See Comments)    Stomach cramps    Meds:  Prescriptions Prior to Admission  Medication Sig Dispense Refill Last Dose  . acetaminophen (TYLENOL) 500 MG tablet Take 1,000 mg by mouth every 6 (six) hours as needed for moderate pain.   08/16/2016 at Unknown time  . Ca Carbonate-Mag Hydroxide (ROLAIDS PO) Take 1-2 tablets by mouth as needed (heartburn).   08/15/2016 at Unknown time  . calcium carbonate (TUMS - DOSED IN MG ELEMENTAL CALCIUM) 500 MG chewable tablet Chew 1 tablet by mouth daily.   08/15/2016 at Unknown time  . omeprazole (PRILOSEC) 20 MG capsule Take 1 capsule (20 mg total) by mouth daily. 30 capsule 1 08/15/2016 at Unknown time  . flintstones complete  (FLINTSTONES) 60 MG chewable tablet Take 2 daily (Patient not taking: Reported on 08/16/2016)   Not Taking at Unknown time    I have reviewed patient's Past Medical Hx, Surgical Hx, Family Hx, Social Hx, medications and allergies.   ROS:  Review of Systems  Constitutional: Negative for chills and fever.  Gastrointestinal: Negative for nausea and vomiting.  Genitourinary: Positive for vaginal discharge. Negative for hematuria and pelvic pain.   Other systems negative  Physical Exam  Patient Vitals for the past 24 hrs:  BP Temp Temp src Pulse Resp SpO2  08/16/16 1423 112/64 98.3 F (36.8 C) Oral 98 16 97 %   Constitutional: Well-developed, well-nourished female in no acute distress.  Cardiovascular: normal rate and rhythm Respiratory: normal effort, clear to auscultation bilaterally GI: Abd soft, non-tender, gravid appropriate for gestational age.   No rebound or guarding. MS: Extremities nontender, no edema, normal ROM Neurologic: Alert and oriented x 4.  GU: Neg CVAT.  PELVIC EXAM: Cervix pink, visually closed, without lesion, scant white creamy discharge, vaginal walls and external genitalia normal    NO POOLING, NO FERNING  Dilation: 2 Effacement (%): 50 Presentation: Vertex Exam by:: Wynelle BourgeoisMarie Reegan Mctighe, CNM  FHT:  Baseline 135 , moderate variability, accelerations present, no decelerations Contractions: Irregular     Labs: Results for orders placed or performed during the hospital encounter of 08/16/16 (from the past 24 hour(s))  Urinalysis, Routine w reflex microscopic     Status: Abnormal   Collection Time: 08/16/16  2:38 PM  Result Value Ref Range   Color, Urine STRAW (A) YELLOW   APPearance HAZY (A) CLEAR   Specific Gravity, Urine 1.005 1.005 - 1.030   pH 6.0 5.0 - 8.0   Glucose, UA NEGATIVE NEGATIVE mg/dL   Hgb urine dipstick NEGATIVE NEGATIVE   Bilirubin Urine NEGATIVE NEGATIVE   Ketones, ur NEGATIVE NEGATIVE mg/dL   Protein, ur NEGATIVE NEGATIVE mg/dL    Nitrite NEGATIVE NEGATIVE   Leukocytes, UA LARGE (A) NEGATIVE   RBC / HPF 0-5 0 - 5 RBC/hpf   WBC, UA 6-30 0 - 5 WBC/hpf   Bacteria, UA RARE (A) NONE SEEN   Squamous Epithelial / LPF 6-30 (A) NONE SEEN   Mucous PRESENT   Fern Test     Status: Normal   Collection Time: 08/16/16  2:42 PM  Result Value Ref Range   POCT Fern Test Negative = intact amniotic membranes       Imaging:  No results found.  MAU Course/MDM: I have ordered labs and reviewed results.  NST reviewed  Assessment: SIUP at 5029w6d Vaginal discharge Back pain of late pregnancy  Plan: Discharge home Labor  precautions and fetal kick counts Follow up in Office for prenatal visits and recheck of cervix  Encouraged to return here or to other Urgent Care/ED if she develops worsening of symptoms, increase in pain, fever, or other concerning symptoms.   Pt stable at time of discharge.  Wynelle BourgeoisMarie Greig Altergott CNM, MSN Certified Nurse-Midwife 08/16/2016 3:11 PM

## 2016-08-17 ENCOUNTER — Encounter: Payer: Self-pay | Admitting: Obstetrics and Gynecology

## 2016-08-17 ENCOUNTER — Ambulatory Visit (INDEPENDENT_AMBULATORY_CARE_PROVIDER_SITE_OTHER): Payer: BC Managed Care – PPO | Admitting: Obstetrics and Gynecology

## 2016-08-17 VITALS — BP 110/70 | HR 88 | Wt 171.0 lb

## 2016-08-17 DIAGNOSIS — Z3483 Encounter for supervision of other normal pregnancy, third trimester: Secondary | ICD-10-CM

## 2016-08-17 DIAGNOSIS — Z331 Pregnant state, incidental: Secondary | ICD-10-CM

## 2016-08-17 DIAGNOSIS — Z3A39 39 weeks gestation of pregnancy: Secondary | ICD-10-CM

## 2016-08-17 DIAGNOSIS — O361931 Maternal care for other isoimmunization, third trimester, fetus 1: Secondary | ICD-10-CM | POA: Diagnosis not present

## 2016-08-17 DIAGNOSIS — Z1389 Encounter for screening for other disorder: Secondary | ICD-10-CM

## 2016-08-17 DIAGNOSIS — Z3403 Encounter for supervision of normal first pregnancy, third trimester: Secondary | ICD-10-CM

## 2016-08-17 DIAGNOSIS — O9989 Other specified diseases and conditions complicating pregnancy, childbirth and the puerperium: Secondary | ICD-10-CM

## 2016-08-17 DIAGNOSIS — K429 Umbilical hernia without obstruction or gangrene: Secondary | ICD-10-CM | POA: Diagnosis not present

## 2016-08-17 DIAGNOSIS — O361990 Maternal care for other isoimmunization, unspecified trimester, not applicable or unspecified: Secondary | ICD-10-CM

## 2016-08-17 LAB — POCT URINALYSIS DIPSTICK
Blood, UA: NEGATIVE
Glucose, UA: NEGATIVE
Ketones, UA: NEGATIVE
LEUKOCYTES UA: NEGATIVE
Nitrite, UA: NEGATIVE
PROTEIN UA: NEGATIVE

## 2016-08-17 NOTE — Progress Notes (Signed)
[id number]  Estimated Date of Delivery: 08/24/16 LROB 6287w0d  Blood pressure 110/70, pulse 88, weight 171 lb (77.6 kg), last menstrual period 11/11/2015, not currently breastfeeding.   Urine results:negative   Chief Complaint  Patient presents with  . Routine Prenatal Visit    Patient complaints: none at this time. Patient reports   good fetal movement,                           denies any bleeding , rupture of membranes,or regular contractions.   refer to the ob flow sheet for FH and FHR, ,                          Physical Examination: General appearance - alert, well appearing, and in no distress                                      Abdomen - FH 37 ,                                                         -FHR 140                                                         soft, nontender, nondistended, no masses or organomegaly                                      Pelvic - cervix is 2 cm                                             Questions were answered. Assessment: LROB [id number] @ 1287w0d , Estimated Date of Delivery: 08/24/16   Plan:  Continued routine obstetrical care,  Membrane "stripping L" attempted.  F/u in 7 days for LROB    By signing my name below, I, Sonum Patel, attest that this documentation has been prepared under the direction and in the presence of Christin BachJohn Heide Brossart, MD. Electronically Signed: Sonum Patel, Neurosurgeoncribe. 08/17/16. 2:58 PM.  I personally performed the services described in this documentation, which was SCRIBED in my presence. The recorded information has been reviewed and considered accurate. It has been edited as necessary during review. Tilda BurrowFERGUSON,Soila Printup V, MD

## 2016-08-18 ENCOUNTER — Inpatient Hospital Stay (HOSPITAL_COMMUNITY)
Admission: AD | Admit: 2016-08-18 | Discharge: 2016-08-18 | Disposition: A | Discharge status: Home / Self Care | Payer: Medicaid Other | Source: Ambulatory Visit | Attending: Obstetrics & Gynecology | Admitting: Obstetrics & Gynecology

## 2016-08-18 ENCOUNTER — Encounter (HOSPITAL_COMMUNITY): Payer: Self-pay | Admitting: *Deleted

## 2016-08-18 NOTE — Discharge Instructions (Signed)
Introduction Patient Name: ________________________________________________ Patient Due Date: ____________________ What is a fetal movement count? A fetal movement count is the number of times that you feel your baby move during a certain amount of time. This may also be called a fetal kick count. A fetal movement count is recommended for every pregnant woman. You may be asked to start counting fetal movements as early as week 28 of your pregnancy. Pay attention to when your baby is most active. You may notice your baby's sleep and wake cycles. You may also notice things that make your baby move more. You should do a fetal movement count:  When your baby is normally most active.  At the same time each day. A good time to count movements is while you are resting, after having something to eat and drink. How do I count fetal movements? 1. Find a quiet, comfortable area. Sit, or lie down on your side. 2. Write down the date, the start time and stop time, and the number of movements that you felt between those two times. Take this information with you to your health care visits. 3. For 2 hours, count kicks, flutters, swishes, rolls, and jabs. You should feel at least 10 movements during 2 hours. 4. You may stop counting after you have felt 10 movements. 5. If you do not feel 10 movements in 2 hours, have something to eat and drink. Then, keep resting and counting for 1 hour. If you feel at least 4 movements during that hour, you may stop counting. Contact a health care provider if:  You feel fewer than 4 movements in 2 hours.  Your baby is not moving like he or she usually does. Date: ____________ Start time: ____________ Stop time: ____________ Movements: ____________ Date: ____________ Start time: ____________ Stop time: ____________ Movements: ____________ Date: ____________ Start time: ____________ Stop time: ____________ Movements: ____________ Date: ____________ Start time: ____________  Stop time: ____________ Movements: ____________ Date: ____________ Start time: ____________ Stop time: ____________ Movements: ____________ Date: ____________ Start time: ____________ Stop time: ____________ Movements: ____________ Date: ____________ Start time: ____________ Stop time: ____________ Movements: ____________ Date: ____________ Start time: ____________ Stop time: ____________ Movements: ____________ Date: ____________ Start time: ____________ Stop time: ____________ Movements: ____________ This information is not intended to replace advice given to you by your health care provider. Make sure you discuss any questions you have with your health care provider. Document Released: 09/14/2006 Document Revised: 04/13/2016 Document Reviewed: 09/24/2015 Elsevier Interactive Patient Education  2017 Else[employer] Inc[employer] Braxton Hicks Contractions Contractions of the uterus can occur throughout pregnancy. Contractions are not always a sign that you are in labor.  WHAT ARE BRAXTON HICKS CONTRACTIONS?  Contractions that occur before labor are called Braxton Hicks contractions, or false labor. Toward the end of pregnancy (32-34 weeks), these contractions can develop more often and may become more forceful. This is not true labor because these contractions do not result in opening (dilatation) and thinning of the cervix. They are sometimes difficult to tell apart from true labor because these contractions can be forceful and people have different pain tolerances. You should not feel embarrassed if you go to the hospital with false labor. Sometimes, the only way to tell if you are in true labor is for your health care provider to look for changes in the cervix. If there are no prenatal problems or other health problems associated with the pregnancy, it is completely safe to be sent home with false labor and await the onset of true labor.   HOW CAN YOU TELL THE DIFFERENCE BETWEEN TRUE AND FALSE LABOR? False Labor     The contractions of false labor are usually shorter and not as hard as those of true labor.   The contractions are usually irregular.   The contractions are often felt in the front of the lower abdomen and in the groin.   The contractions may go away when you walk around or change positions while lying down.   The contractions get weaker and are shorter lasting as time goes on.   The contractions do not usually become progressively stronger, regular, and closer together as with true labor.  True Labor   Contractions in true labor last 30-70 seconds, become very regular, usually become more intense, and increase in frequency.   The contractions do not go away with walking.   The discomfort is usually felt in the top of the uterus and spreads to the lower abdomen and low back.   True labor can be determined by your health care provider with an exam. This will show that the cervix is dilating and getting thinner.  WHAT TO REMEMBER  Keep up with your usual exercises and follow other instructions given by your health care provider.   Take medicines as directed by your health care provider.   Keep your regular prenatal appointments.   Eat and drink lightly if you think you are going into labor.   If Braxton Hicks contractions are making you uncomfortable:   Change your position from lying down or resting to walking, or from walking to resting.   Sit and rest in a tub of warm water.   Drink 2-3 glasses of water. Dehydration may cause these contractions.   Do slow and deep breathing several times an hour.  WHEN SHOULD I SEEK IMMEDIATE MEDICAL CARE? Seek immediate medical care if:  Your contractions become stronger, more regular, and closer together.   You have fluid leaking or gushing from your vagina.   You have a fever.   You pass blood-tinged mucus.   You have vaginal bleeding.   You have continuous abdominal pain.   You have low back pain  that you never had before.   You feel your baby's head pushing down and causing pelvic pressure.   Your baby is not moving as much as it used to.  This information is not intended to replace advice given to you by your health care provider. Make sure you discuss any questions you have with your health care provider. Document Released: 08/15/2005 Document Revised: 12/07/2015 Document Reviewed: 05/27/2013 Elsevier Interactive Patient Education  2017 Elsevier Inc.  

## 2016-08-18 NOTE — MAU Note (Signed)
Patient presents with contractions 4-5 min apart and has been contracting since 11pm.  States she had her membranes swept yesterday.  Denies LOF.  Reports good fetal movement.

## 2016-08-19 ENCOUNTER — Inpatient Hospital Stay (HOSPITAL_COMMUNITY): Payer: Medicaid Other | Admitting: Anesthesiology

## 2016-08-19 ENCOUNTER — Inpatient Hospital Stay (HOSPITAL_COMMUNITY)
Admission: AD | Admit: 2016-08-19 | Discharge: 2016-08-20 | DRG: 775 | Disposition: A | Discharge status: Home / Self Care | Payer: Medicaid Other | Source: Ambulatory Visit | Attending: Obstetrics and Gynecology | Admitting: Obstetrics and Gynecology

## 2016-08-19 ENCOUNTER — Encounter (HOSPITAL_COMMUNITY): Payer: Self-pay

## 2016-08-19 DIAGNOSIS — Z87891 Personal history of nicotine dependence: Secondary | ICD-10-CM | POA: Diagnosis not present

## 2016-08-19 DIAGNOSIS — O361930 Maternal care for other isoimmunization, third trimester, not applicable or unspecified: Secondary | ICD-10-CM | POA: Diagnosis present

## 2016-08-19 DIAGNOSIS — Z833 Family history of diabetes mellitus: Secondary | ICD-10-CM

## 2016-08-19 DIAGNOSIS — K219 Gastro-esophageal reflux disease without esophagitis: Secondary | ICD-10-CM | POA: Diagnosis present

## 2016-08-19 DIAGNOSIS — Z3A39 39 weeks gestation of pregnancy: Secondary | ICD-10-CM

## 2016-08-19 DIAGNOSIS — O9962 Diseases of the digestive system complicating childbirth: Secondary | ICD-10-CM | POA: Diagnosis present

## 2016-08-19 DIAGNOSIS — Z3493 Encounter for supervision of normal pregnancy, unspecified, third trimester: Secondary | ICD-10-CM

## 2016-08-19 DIAGNOSIS — Z8249 Family history of ischemic heart disease and other diseases of the circulatory system: Secondary | ICD-10-CM

## 2016-08-19 LAB — TYPE AND SCREEN
ABO/RH(D): A POS
ANTIBODY SCREEN: NEGATIVE

## 2016-08-19 LAB — CBC
HEMATOCRIT: 34.1 % — AB (ref 36.0–46.0)
HEMOGLOBIN: 11.8 g/dL — AB (ref 12.0–15.0)
MCH: 31 pg (ref 26.0–34.0)
MCHC: 34.6 g/dL (ref 30.0–36.0)
MCV: 89.5 fL (ref 78.0–100.0)
Platelets: 211 10*3/uL (ref 150–400)
RBC: 3.81 MIL/uL — AB (ref 3.87–5.11)
RDW: 13.4 % (ref 11.5–15.5)
WBC: 11.3 10*3/uL — ABNORMAL HIGH (ref 4.0–10.5)

## 2016-08-19 LAB — POCT FERN TEST

## 2016-08-19 MED ORDER — EPHEDRINE 5 MG/ML INJ: 10.0000 mg | INTRAVENOUS | Status: DC | PRN

## 2016-08-19 MED ORDER — LACTATED RINGERS IV SOLN
INTRAVENOUS | Status: DC
  Administered 2016-08-19 (×3): via INTRAVENOUS

## 2016-08-19 MED ORDER — LACTATED RINGERS IV SOLN
500.0000 mL | Freq: Once | INTRAVENOUS | Status: AC
  Administered 2016-08-19: 500 mL via INTRAVENOUS

## 2016-08-19 MED ORDER — EPHEDRINE 5 MG/ML INJ
10.0000 mg | INTRAVENOUS | Status: DC | PRN
  Filled 2016-08-19: qty 4

## 2016-08-19 MED ORDER — BENZOCAINE-MENTHOL 20-0.5 % EX AERO
1.0000 "application " | INHALATION_SPRAY | CUTANEOUS | Status: DC | PRN
  Administered 2016-08-19: 1 via TOPICAL
  Filled 2016-08-19: qty 56

## 2016-08-19 MED ORDER — IBUPROFEN 600 MG PO TABS
600.0000 mg | ORAL_TABLET | Freq: Four times a day (QID) | ORAL | Status: DC
  Administered 2016-08-19 – 2016-08-20 (×4): 600 mg via ORAL
  Filled 2016-08-19 (×4): qty 1

## 2016-08-19 MED ORDER — COCONUT OIL OIL
1.0000 "application " | TOPICAL_OIL | Status: DC | PRN
  Administered 2016-08-20: 1 via TOPICAL
  Filled 2016-08-19: qty 120

## 2016-08-19 MED ORDER — PHENYLEPHRINE 40 MCG/ML (10ML) SYRINGE FOR IV PUSH (FOR BLOOD PRESSURE SUPPORT)
80.0000 ug | PREFILLED_SYRINGE | INTRAVENOUS | Status: DC | PRN
  Filled 2016-08-19: qty 10
  Filled 2016-08-19: qty 5

## 2016-08-19 MED ORDER — DIPHENHYDRAMINE HCL 50 MG/ML IJ SOLN: 12.5000 mg | INTRAMUSCULAR | Status: DC | PRN

## 2016-08-19 MED ORDER — OXYTOCIN 40 UNITS IN LACTATED RINGERS INFUSION - SIMPLE MED
2.5000 [IU]/h | INTRAVENOUS | Status: DC
  Administered 2016-08-19: 2.5 [IU]/h via INTRAVENOUS
  Filled 2016-08-19: qty 1000

## 2016-08-19 MED ORDER — PHENYLEPHRINE 40 MCG/ML (10ML) SYRINGE FOR IV PUSH (FOR BLOOD PRESSURE SUPPORT): 80.0000 ug | PREFILLED_SYRINGE | INTRAVENOUS | Status: DC | PRN

## 2016-08-19 MED ORDER — PRENATAL MULTIVITAMIN CH
1.0000 | ORAL_TABLET | Freq: Every day | ORAL | Status: DC
  Administered 2016-08-20: 1 via ORAL
  Filled 2016-08-19: qty 1

## 2016-08-19 MED ORDER — PHENYLEPHRINE 40 MCG/ML (10ML) SYRINGE FOR IV PUSH (FOR BLOOD PRESSURE SUPPORT)
80.0000 ug | PREFILLED_SYRINGE | INTRAVENOUS | Status: DC | PRN
  Filled 2016-08-19: qty 5

## 2016-08-19 MED ORDER — LIDOCAINE HCL (PF) 1 % IJ SOLN
30.0000 mL | INTRAMUSCULAR | Status: DC | PRN
  Filled 2016-08-19: qty 30

## 2016-08-19 MED ORDER — SIMETHICONE 80 MG PO CHEW: 80.0000 mg | CHEWABLE_TABLET | ORAL | Status: DC | PRN

## 2016-08-19 MED ORDER — LACTATED RINGERS IV SOLN: 500.0000 mL | INTRAVENOUS | Status: DC | PRN

## 2016-08-19 MED ORDER — LACTATED RINGERS IV SOLN: 500.0000 mL | Freq: Once | INTRAVENOUS | Status: DC

## 2016-08-19 MED ORDER — SOD CITRATE-CITRIC ACID 500-334 MG/5ML PO SOLN: 30.0000 mL | ORAL | Status: DC | PRN

## 2016-08-19 MED ORDER — DIBUCAINE 1 % RE OINT: 1.0000 "application " | TOPICAL_OINTMENT | RECTAL | Status: DC | PRN

## 2016-08-19 MED ORDER — WITCH HAZEL-GLYCERIN EX PADS: 1.0000 "application " | MEDICATED_PAD | CUTANEOUS | Status: DC | PRN

## 2016-08-19 MED ORDER — ONDANSETRON HCL 4 MG/2ML IJ SOLN
4.0000 mg | Freq: Four times a day (QID) | INTRAMUSCULAR | Status: DC | PRN
  Administered 2016-08-19: 4 mg via INTRAVENOUS
  Filled 2016-08-19: qty 2

## 2016-08-19 MED ORDER — OXYTOCIN BOLUS FROM INFUSION
500.0000 mL | Freq: Once | INTRAVENOUS | Status: AC
  Administered 2016-08-19: 500 mL via INTRAVENOUS

## 2016-08-19 MED ORDER — ONDANSETRON HCL 4 MG PO TABS: 4.0000 mg | ORAL_TABLET | ORAL | Status: DC | PRN

## 2016-08-19 MED ORDER — SENNOSIDES-DOCUSATE SODIUM 8.6-50 MG PO TABS
2.0000 | ORAL_TABLET | ORAL | Status: DC
  Filled 2016-08-19: qty 2

## 2016-08-19 MED ORDER — OXYCODONE-ACETAMINOPHEN 5-325 MG PO TABS: 1.0000 | ORAL_TABLET | ORAL | Status: DC | PRN

## 2016-08-19 MED ORDER — ACETAMINOPHEN 325 MG PO TABS: 650.0000 mg | ORAL_TABLET | ORAL | Status: DC | PRN

## 2016-08-19 MED ORDER — TETANUS-DIPHTH-ACELL PERTUSSIS 5-2.5-18.5 LF-MCG/0.5 IM SUSP
0.5000 mL | Freq: Once | INTRAMUSCULAR | Status: DC
  Filled 2016-08-19: qty 0.5

## 2016-08-19 MED ORDER — FENTANYL 2.5 MCG/ML BUPIVACAINE 1/10 % EPIDURAL INFUSION (WH - ANES)
14.0000 mL/h | INTRAMUSCULAR | Status: DC | PRN
  Administered 2016-08-19: 12 mL/h via EPIDURAL
  Filled 2016-08-19: qty 100

## 2016-08-19 MED ORDER — ZOLPIDEM TARTRATE 5 MG PO TABS
5.0000 mg | ORAL_TABLET | Freq: Every evening | ORAL | Status: DC | PRN
Start: 2016-08-19 — End: 2016-08-21

## 2016-08-19 MED ORDER — OXYCODONE-ACETAMINOPHEN 5-325 MG PO TABS: 2.0000 | ORAL_TABLET | ORAL | Status: DC | PRN

## 2016-08-19 MED ORDER — LIDOCAINE HCL (PF) 1 % IJ SOLN
INTRAMUSCULAR | Status: DC | PRN
  Administered 2016-08-19 (×2): 5 mL via EPIDURAL

## 2016-08-19 MED ORDER — DIPHENHYDRAMINE HCL 25 MG PO CAPS: 25.0000 mg | ORAL_CAPSULE | Freq: Four times a day (QID) | ORAL | Status: DC | PRN

## 2016-08-19 MED ORDER — ONDANSETRON HCL 4 MG/2ML IJ SOLN: 4.0000 mg | INTRAMUSCULAR | Status: DC | PRN

## 2016-08-19 NOTE — Anesthesia Procedure Notes (Signed)
Epidural Patient location during procedure: OB Start time: 08/19/2016 2:02 PM End time: 08/19/2016 2:30 PM  Staffing Anesthesiologist: Jairo BenJACKSON, Kealani Leckey Performed: anesthesiologist   Preanesthetic Checklist Completed: patient identified, surgical consent, pre-op evaluation, timeout performed, IV checked, risks and benefits discussed and monitors and equipment checked  Epidural Patient position: sitting Prep: site prepped and draped and DuraPrep Patient monitoring: blood pressure, continuous pulse ox and heart rate Approach: midline Location: L2-L3 Injection technique: LOR air  Needle:  Needle type: Tuohy  Needle gauge: 17 G Needle length: 9 cm Needle insertion depth: 7 cm Catheter type: closed end flexible Catheter size: 19 Gauge Catheter at skin depth: 12 cm Test dose: negative (1% lidocaine)  Assessment Events: blood not aspirated, injection not painful, no injection resistance, negative IV test and no paresthesia  Additional Notes Pt identified in Labor room.  Monitors applied. Working IV access confirmed. Sterile prep, drape lumbar spine.  1% lido local L 2,3.  #17ga Touhy LOR air at 7 cm L 2,3, cath in easily to 12 cm skin. Test dose OK, cath dosed and infusion begun.  Patient asymptomatic, VSS, no heme aspirated, tolerated well.  Sandford Craze Juventino Pavone, MDReason for block:procedure for pain

## 2016-08-19 NOTE — MAU Note (Signed)
Thinks leaking fluid since 0430 contractions since 0200 this morning 3 to 4 minutes aparts.

## 2016-08-19 NOTE — H&P (Signed)
LABOR AND DELIVERY ADMISSION HISTORY AND PHYSICAL NOTE  Kristina Robbins is a 20 y.o. female [phone] with IUP at 191w2d by 10 wks presenting for SOL.   She reports positive fetal movement. She denies leakage of fluid or vaginal bleeding.  Prenatal History/Complications:  Past Medical History: Past Medical History:  Diagnosis Date  . Cystic fibroadenosis of breast   . Irritable bowel syndrome   . Missed period 05/22/2015  . Pityriasis rosea   . Pregnant 12/14/2015    Past Surgical History: Past Surgical History:  Procedure Laterality Date  . NO PAST SURGERIES      Obstetrical History: OB History    Gravida Para Term Preterm AB Living   4 1 1   2 1    SAB TAB Ectopic Multiple Live Births   2     0 1      Social History: Social History   Social History  . Marital status: Married    Spouse name: N/A  . Number of children: N/A  . Years of education: N/A   Social History Main Topics  . Smoking status: Former Smoker    Packs/day: 0.50    Years: 2.00    Types: Cigarettes  . Smokeless tobacco: Never Used  . Alcohol use No  . Drug use: No  . Sexual activity: Yes    Birth control/ protection: None   Other Topics Concern  . None   Social History Narrative  . None    Family History: Family History  Problem Relation Age of Onset  . Cystic fibrosis Mother   . Mental illness Mother   . Depression Mother   . Bipolar disorder Mother   . Paranoid behavior Mother   . Schizophrenia Mother   . Diabetes Mother   . Hypertension Mother   . Autism Brother   . Other Maternal Grandfather     muscle degeneration  . Diabetes Maternal Grandfather   . Hypertension Maternal Grandfather   . Hypertension Maternal Grandmother   . Hypertension Paternal Grandmother   . Cancer Paternal Grandmother     breast  . Hypertension Paternal Grandfather   . Diabetes Paternal Uncle   . Hypertension Paternal Uncle   . Cancer Paternal Aunt     lung    Allergies: Allergies   Allergen Reactions  . Omnicef [Cefdinir] Nausea Only and Other (See Comments)    Stomach cramps    Prescriptions Prior to Admission  Medication Sig Dispense Refill Last Dose  . acetaminophen (TYLENOL) 500 MG tablet Take 1,000 mg by mouth every 6 (six) hours as needed for moderate pain.   08/18/2016 at Unknown time  . Ca Carbonate-Mag Hydroxide (ROLAIDS PO) Take 1-2 tablets by mouth daily as needed (heartburn).    Past Week at Unknown time  . calcium carbonate (TUMS - DOSED IN MG ELEMENTAL CALCIUM) 500 MG chewable tablet Chew 1 tablet by mouth 2 (two) times daily as needed for indigestion or heartburn.    Past Week at Unknown time  . omeprazole (PRILOSEC) 20 MG capsule Take 1 capsule (20 mg total) by mouth daily. 30 capsule 1 Past Week at Unknown time  . flintstones complete (FLINTSTONES) 60 MG chewable tablet Take 2 daily (Patient not taking: Reported on 08/19/2016)   More than a month at Unknown time     Review of Systems   All systems reviewed and negative except as stated in HPI  Blood pressure 108/62, pulse 72, temperature 98.4 F (36.9 C), temperature source Oral, resp. rate  16, height 5\' 3"  (1.6 m), weight 168 lb (76.2 kg), last menstrual period 11/11/2015, SpO2 99 %, not currently breastfeeding. General appearance: alert, cooperative and appears stated age Lungs: clear to auscultation bilaterally Heart: regular rate and rhythm Abdomen: soft, non-tender; bowel sounds normal Extremities: No calf swelling or tenderness Presentation: cephalic by nursing exam Fetal monitoring: category 1 Uterine activity: contraction q3-5 minutes Dilation: 10 Effacement (%): 100 Station: +3 Exam by:: Dr. Genevie AnnSchenk   Prenatal labs: ABO, Rh: --/--/A POS (12/22 1220) Antibody: NEG (12/22 1220) Rubella: immune RPR: Non Reactive (10/05 0909)  HBsAg: Negative (07/27 1632)  HIV: Non Reactive (10/05 0909)  GBS: Negative (12/07 0000)  2 hr Glucola: normal Genetic screening:  1st trimester  negative Anatomy US: normal  Prenatal Transfer Tool  Maternal Diabetes: No Genetic Screening: Normal Maternal Ultrasounds/Referrals: Normal Fetal Ultrasounds or other Referrals:  None Maternal Substance Abuse:  No Significant Maternal Medications:  None Significant Maternal Lab Results: Lab values include: Group B Strep negative  Results for orders placed or performed during the hospital encounter of 08/19/16 (from the past 24 hour(s))  Fern Test   Collection Time: 08/19/16 10:45 AM  Result Value Ref Range   POCT Fern Test    CBC   Collection Time: 08/19/16 12:20 PM  Result Value Ref Range   WBC 11.3 (H) 4.0 - 10.5 K/uL   RBC 3.81 (L) 3.87 - 5.11 MIL/uL   Hemoglobin 11.8 (L) 12.0 - 15.0 g/dL   HCT 16.134.1 (L) 09.636.0 - 04.546.0 %   MCV 89.5 78.0 - 100.0 fL   MCH 31.0 26.0 - 34.0 pg   MCHC 34.6 30.0 - 36.0 g/dL   RDW 40.913.4 81.111.5 - 91.415.5 %   Platelets 211 150 - 400 K/uL  Type and screen [facility]   Collection Time: 08/19/16 12:20 PM  Result Value Ref Range   ABO/RH(D) A POS    Antibody Screen NEG    Sample Expiration 08/22/2016     Patient Active Problem List   Diagnosis Date Noted  . Pregnant and not yet delivered, third trimester 08/19/2016  . Anti-M isoimmunization affecting pregnancy, antepartum, now resolved 08/17/2016  . History of postpartum depression, currently pregnant 07/07/2016  . Supervision of normal pregnancy 02/02/2016  . Umbilical hernia without obstruction and without gangrene 01/21/2015    Assessment: Kristina MarlinBrittany Hiatt Theard is a 20 y.o. [phone] at 1231w2d here for SOL  #Labor:expectant mangement #Pain: epidural #FWB: Category 1 #ID:  GBS negative #MOF: breast #Circ:  Yes at [facility]  Ernestina Pennaicholas Lantz Hermann 08/19/2016, 7:07 PM

## 2016-08-19 NOTE — MAU Provider Note (Signed)
History     CSN: [id number]  Arrival date and time: 08/19/16 0934   None     Chief Complaint  Patient presents with  . Rupture of Membranes  . Contractions   HPI   Ms.[name] is a 20 y.o. female [phone] @ 5455w2d here in MAU with leaking of fluid and contractions. Patient says she had two gushes of fluid this morning "not sure if I peed on myself".   OB History    Gravida Para Term Preterm AB Living   4 1 1   2 1    SAB TAB Ectopic Multiple Live Births   2     0 1      Past Medical History:  Diagnosis Date  . Cystic fibroadenosis of breast   . Irritable bowel syndrome   . Missed period 05/22/2015  . Pityriasis rosea   . Pregnant 12/14/2015    Past Surgical History:  Procedure Laterality Date  . NO PAST SURGERIES      Family History  Problem Relation Age of Onset  . Cystic fibrosis Mother   . Mental illness Mother   . Depression Mother   . Bipolar disorder Mother   . Paranoid behavior Mother   . Schizophrenia Mother   . Diabetes Mother   . Hypertension Mother   . Autism Brother   . Other Maternal Grandfather     muscle degeneration  . Diabetes Maternal Grandfather   . Hypertension Maternal Grandfather   . Hypertension Maternal Grandmother   . Hypertension Paternal Grandmother   . Cancer Paternal Grandmother     breast  . Hypertension Paternal Grandfather   . Diabetes Paternal Uncle   . Hypertension Paternal Uncle   . Cancer Paternal Aunt     lung    Social History  Substance Use Topics  . Smoking status: Former Smoker    Packs/day: 0.50    Years: 2.00    Types: Cigarettes  . Smokeless tobacco: Never Used  . Alcohol use No    Allergies:  Allergies  Allergen Reactions  . Omnicef [Cefdinir] Nausea Only and Other (See Comments)    Stomach cramps    Prescriptions Prior to Admission  Medication Sig Dispense Refill Last Dose  . acetaminophen (TYLENOL) 500 MG tablet Take 1,000 mg by mouth every 6 (six) hours as needed for  moderate pain.   08/17/2016 at Unknown time  . Ca Carbonate-Mag Hydroxide (ROLAIDS PO) Take 1-2 tablets by mouth daily as needed (heartburn).    Past Week at Unknown time  . calcium carbonate (TUMS - DOSED IN MG ELEMENTAL CALCIUM) 500 MG chewable tablet Chew 1 tablet by mouth 2 (two) times daily as needed for indigestion or heartburn.    Past Week at Unknown time  . flintstones complete (FLINTSTONES) 60 MG chewable tablet Take 2 daily (Patient not taking: Reported on 08/18/2016)   Not Taking at Unknown time  . omeprazole (PRILOSEC) 20 MG capsule Take 1 capsule (20 mg total) by mouth daily. 30 capsule 1 08/17/2016 at Unknown time   Results for orders placed or performed during the hospital encounter of 08/19/16 (from the past 48 hour(s))  Fern Test     Status: Normal   Collection Time: 08/19/16 10:45 AM  Result Value Ref Range   POCT Fern Test      Review of Systems  Constitutional: Negative for chills and fever.  Genitourinary: Negative for dysuria.   Physical Exam   Blood pressure 114/76, pulse 101, temperature 98.2 F (  36.8 C), temperature source Oral, height 5\' 3"  (1.6 m), weight 168 lb (76.2 kg), last menstrual period 11/11/2015, not currently breastfeeding.  Physical Exam  Constitutional: She appears well-developed and well-nourished. No distress.  Genitourinary:  Genitourinary Comments: Vagina - Small amount of mucoid vaginal discharge. No pooling of fluid in the vagina.  Cervix - No contact bleeding, no active bleeding  Chaperone present for exam.   Skin: She is not diaphoretic.    MAU Course  Procedures  None  MDM  No obvious sign of pooling; fern slide negative  Kristina BourgeoisMarie Robbins in to strip membranes.  Cervical change per RN indicating active labor.  Dilation: 4.5 Effacement (%): 80 Cervical Position: Middle Station: -2 Exam by:: Laurell Josephsheryl Anderson RN  Assessment and Plan   A:  Active labor GBS negative Category 1 feta tracing   P:  Admit to Labor and  Delivery  Duane LopeJennifer I Benji Poynter, NP 08/19/2016 12:13 PM

## 2016-08-19 NOTE — Anesthesia Preprocedure Evaluation (Signed)
Anesthesia Evaluation  Patient identified by MRN, date of birth, ID band Patient awake    Reviewed: Allergy & Precautions, NPO status , Patient's Chart, lab work & pertinent test results  History of Anesthesia Complications Negative for: history of anesthetic complications  Airway Mallampati: II  TM Distance: >3 FB Neck ROM: Full    Dental  (+) Dental Advisory Given   Pulmonary former smoker,    breath sounds clear to auscultation       Cardiovascular negative cardio ROS   Rhythm:Regular Rate:Normal     Neuro/Psych negative neurological ROS     GI/Hepatic Neg liver ROS, GERD  Controlled,  Endo/Other  negative endocrine ROS  Renal/GU negative Renal ROS     Musculoskeletal   Abdominal   Peds  Hematology  (+) anemia , plt 211k   Anesthesia Other Findings   Reproductive/Obstetrics                             Anesthesia Physical Anesthesia Plan  ASA: II  Anesthesia Plan: Epidural   Post-op Pain Management:    Induction:   Airway Management Planned: Natural Airway  Additional Equipment:   Intra-op Plan:   Post-operative Plan:   Informed Consent: I have reviewed the patients History and Physical, chart, labs and discussed the procedure including the risks, benefits and alternatives for the proposed anesthesia with the patient or authorized representative who has indicated his/her understanding and acceptance.     Plan Discussed with:   Anesthesia Plan Comments: (Patient identified. Risks/Benefits/Options discussed with patient including but not limited to bleeding, infection, nerve damage, paralysis, failed block, incomplete pain control, headache, blood pressure changes, nausea, vomiting, reactions to medication both or allergic, itching and postpartum back pain. Confirmed with bedside nurse the patient's most recent platelet count. Confirmed with patient that they are not  currently taking any anticoagulation, have any bleeding history or any family history of bleeding disorders. Patient expressed understanding and wished to proceed. All questions were answered. )        Anesthesia Quick Evaluation

## 2016-08-20 LAB — RPR: RPR: NONREACTIVE

## 2016-08-20 MED ORDER — IBUPROFEN 600 MG PO TABS: 600.0000 mg | ORAL_TABLET | Freq: Four times a day (QID) | ORAL | 0 refills | Status: AC

## 2016-08-20 NOTE — Discharge Summary (Signed)
OB Discharge Summary     Patient Name: Kristina Robbins DOB: [birth year] MRN: [id number]  Date of admission: 08/19/2016 Delivering MD: Lorne SkeensSCHENK, NICHOLAS MICHAEL   Date of discharge: 08/20/2016  Admitting diagnosis: 39WKS, LEAKING,CTX Intrauterine pregnancy: 6553w2d     Secondary diagnosis:  Active Problems:   Pregnant and not yet delivered, third trimester  Additional problems: None     Discharge diagnosis: Term Pregnancy Delivered                                                                                                Post partum procedures:None  Augmentation: AROM  Complications: None  Hospital course:  Onset of Labor With Vaginal Delivery     20 y.o. yo [id number] at 6653w2d was admitted in Active Labor on 08/19/2016. Patient had an uncomplicated labor course as follows:  Membrane Rupture Time/Date: 7:05 PM ,08/19/2016   Intrapartum Procedures: Episiotomy: None [1]                                         Lacerations:  None [1];Periurethral [8]  Patient had a delivery of a Viable infant. 08/19/2016  Information for the patient's newborn:  Kristina Robbins, Kristina Robbins [[id number]][[id number]]  Delivery Method: Vaginal, Spontaneous Delivery (Filed from Delivery Summary)    Pateint had an uncomplicated postpartum course.  She is ambulating, tolerating a regular diet, passing flatus, and urinating well. Patient is discharged home in stable condition on 08/20/16.    Physical exam Vitals:   08/19/16 2137 08/19/16 2210 08/20/16 0210 08/20/16 1013  BP:  109/68 (!) 99/54 (!) 114/57  Pulse:  72 60 72  Resp:  18 18 (!) 50  Temp: 98.5 F (36.9 C) 98.3 F (36.8 C) 98 F (36.7 C)   TempSrc: Oral Oral Oral   SpO2:  100% 99%   Weight:      Height:       General: alert and no distress Lochia: appropriate Uterine Fundus: firm DVT Evaluation: No evidence of DVT seen on physical exam. Negative Homan's sign. Labs: Lab Results  Component Value Date   WBC 11.3 (H) 08/19/2016   HGB 11.8  (L) 08/19/2016   HCT 34.1 (L) 08/19/2016   MCV 89.5 08/19/2016   PLT 211 08/19/2016   No flowsheet data found.  Discharge instruction: per After Visit Summary and "Baby and Me Booklet".  After visit meds:  Allergies as of 08/20/2016      Reactions   Omnicef [cefdinir] Nausea Only, Other (See Comments)   Stomach cramps      Medication List    TAKE these medications   acetaminophen 500 MG tablet Commonly known as:  TYLENOL Take 1,000 mg by mouth every 6 (six) hours as needed for moderate pain.   calcium carbonate 500 MG chewable tablet Commonly known as:  TUMS - dosed in mg elemental calcium Chew 1 tablet by mouth 2 (two) times daily as needed for indigestion or heartburn.   flintstones complete 60 MG chewable tablet Take 2 daily  ibuprofen 600 MG tablet Commonly known as:  ADVIL,MOTRIN Take 1 tablet (600 mg total) by mouth every 6 (six) hours.   omeprazole 20 MG capsule Commonly known as:  PRILOSEC Take 1 capsule (20 mg total) by mouth daily.   ROLAIDS PO Take 1-2 tablets by mouth daily as needed (heartburn).       Diet: routine diet  Activity: Advance as tolerated. Pelvic rest for 6 weeks.   Outpatient follow up:6 weeks Follow up Appt:Future Appointments Date Time Provider Department Center  08/24/2016 3:30 PM Tilda BurrowJohn V Ferguson, MD [facility]   Follow up Visit:No Follow-up on file.  Postpartum contraception: IUD Paragard  Newborn Data: Live born female  Birth Weight: 7 lb 3.2 oz (3265 g) APGAR: 8, 9  Baby Feeding: Breast Disposition:home with mother   08/20/2016 Kristina CollinsUgonna Tommye Lehenbauer, MD

## 2016-08-20 NOTE — Discharge Instructions (Signed)

## 2016-08-20 NOTE — Progress Notes (Signed)
CSW met with MOB at bedside to address consult ordered for her hx of PPD. Upon this writers arrival, MOB notes she has no hx of PPD and is unsure where anyone got that from. Due to no hx of PPD, MOB did not want to participate in assessment. CSW apologized for miscommunication. At this time, no other needs addressed or requested. Case closed to this CSW.   Arvis Miguez, MSW, LCSW-A Clinical Social Worker  [facility]  Office: [phone]

## 2016-08-20 NOTE — Anesthesia Postprocedure Evaluation (Signed)
Anesthesia Post Note  Patient: Kristina Robbins  Procedure(s) Performed: * No procedures listed *  Patient location during evaluation: Mother Baby Anesthesia Type: Epidural Level of consciousness: awake and alert Pain management: satisfactory to patient Vital Signs Assessment: post-procedure vital signs reviewed and stable Respiratory status: respiratory function stable Cardiovascular status: stable Postop Assessment: no headache, no backache, epidural receding, patient able to bend at knees, no signs of nausea or vomiting and adequate PO intake Anesthetic complications: no        Last Vitals:  Vitals:   08/19/16 2210 08/20/16 0210  BP: 109/68 (!) 99/54  Pulse: 72 60  Resp: 18 18  Temp: 36.8 C 36.7 C    Last Pain:  Vitals:   08/20/16 0638  TempSrc:   PainSc: 4    Pain Goal:                 Kristina Robbins

## 2016-08-20 NOTE — Lactation Note (Signed)
This note was copied from a baby's chart. Lactation Consultation Note  Patient Name: Boy Bartholomew BoardsBrittany Mach WUJWJ'XToday's Date: 08/20/2016 Reason for consult: Follow-up assessment   Mom had just finished giving baby a bottle when LC entered room.  She discussed with LC her desire to feed baby with formula until her milk comes in in two days.  Mom had used personal pump earlier but told LC she didn't get anything so she did not have any milk yet.  LC explained supply and demand and taught mom how to hand express colostrum.  Mom had drops of colostrum easily expressed.  LC taught mom to use EBM on nipples and to hand express prior to feeding.  Mom expressed her desire to dc tonight.  LC reviewed how to know when a breastfed baby is getting enough to eat.  LC also discussed engorgement care with mom and BM storage.  Guidelines for supplementation were also reviewed and the importance of not over feeding the baby.  RN taking care of mom had given guidelines and also reviewed them with mom.  Mom gave two bottles; each well over the recommended guideline amount.  LC explained why supplementation was counterproductive to establishing a good milk supply.  Baby showed cues so mom placed baby at breast and baby latched well but was too sleepy/full to stay actively eating at breast.  Mom was given BF brochure and informed about BF support groups.  Mom told LC she plans to only breastfeed now because she saw the colostrum.  Mom states she did not want to give formula prior to DC because she does want to breastfeed and she is having an easier time with this baby than her previous child, (now 2317 months old), that she breastfed for 1 month and quit due to a drop in her milk supply.  LC encouraged mom to feed baby a minimum of 8-12 times in a 24 hour period.  Encouraged mom to call out if she has further questions or concerns prior to DC.     Maternal Data    Feeding Feeding Type: Breast Fed Length of feed: 2 min (baby  appeared full after formula but latched well)  LATCH Score/Interventions Latch: Grasps breast easily, tongue down, lips flanged, rhythmical sucking. Intervention(s): Assist with latch;Adjust position;Breast compression;Breast massage  Audible Swallowing: A few with stimulation Intervention(s): Hand expression;Skin to skin  Type of Nipple: Everted at rest and after stimulation  Comfort (Breast/Nipple): Filling, red/small blisters or bruises, mild/mod discomfort (right nipple tender/slightly red/ebm to nipple)  Problem noted: Mild/Moderate discomfort (taught mom hand expression/ebm to nipple) Interventions (Mild/moderate discomfort): Hand expression  Hold (Positioning): Assistance needed to correctly position infant at breast and maintain latch.  LATCH Score: 7  Lactation Tools Discussed/Used     Consult Status Consult Status: Complete    Maryruth HancockKelly Suzanne [facility] Center[facility] 08/20/2016, 4:33 PM

## 2016-08-24 ENCOUNTER — Encounter: Payer: BC Managed Care – PPO | Admitting: Obstetrics and Gynecology

## 2016-09-01 ENCOUNTER — Encounter: Payer: Self-pay | Admitting: Women's Health

## 2016-09-16 ENCOUNTER — Ambulatory Visit: Payer: BC Managed Care – PPO | Admitting: Adult Health

## 2016-09-23 ENCOUNTER — Ambulatory Visit: Payer: BC Managed Care – PPO | Admitting: Women's Health

## 2016-11-03 IMAGING — US US FETAL BPP W/O NONSTRESS
1 series · 14 of 16 positions shown · non-contrast
Comparison: none

[Series 1: us fetal bpp w/o nonstress · non-contrast · 16 acquisitions, 14 frames shown]
[im 1/16]
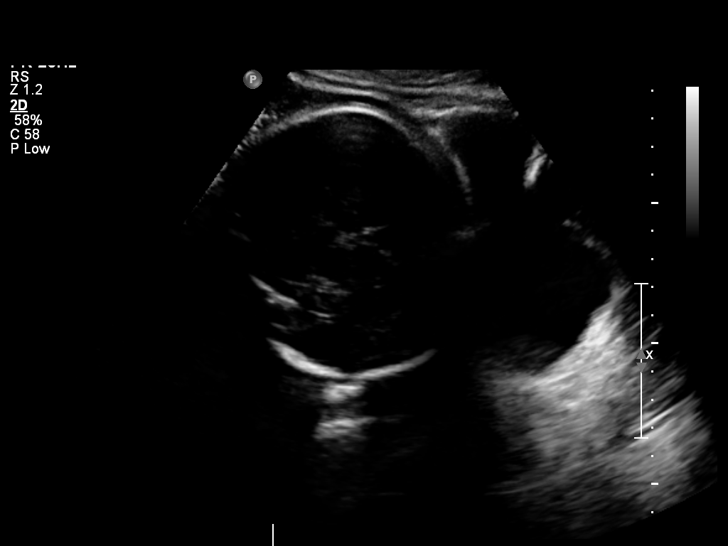
[im 2/16]
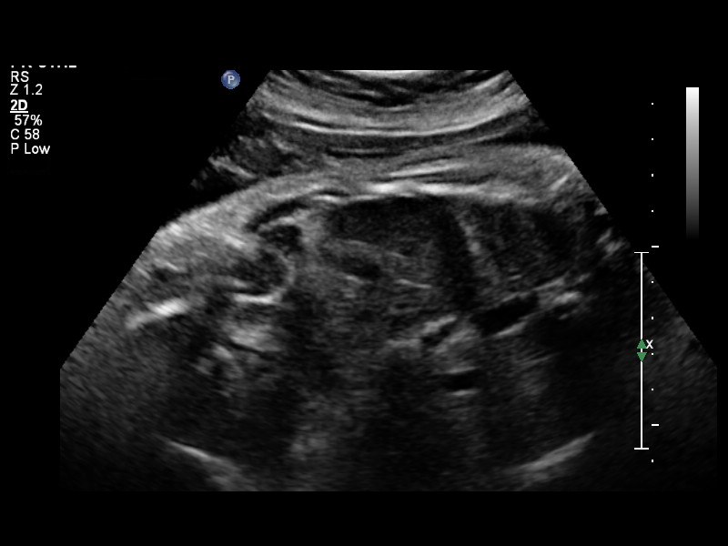
[im 3/16]
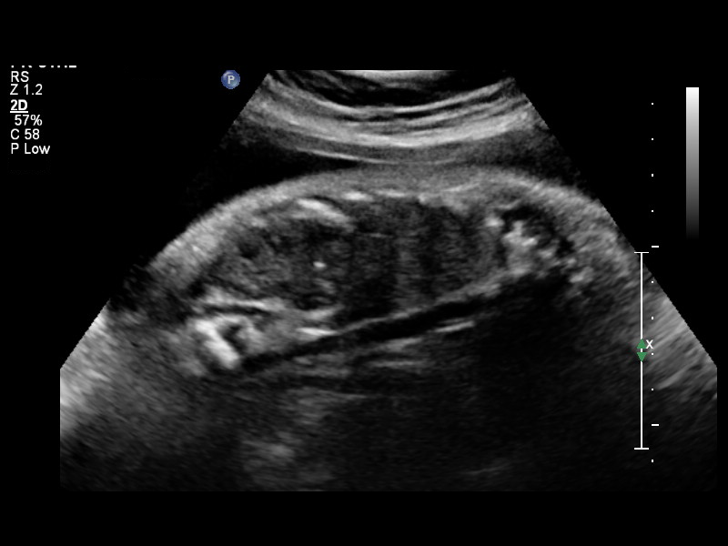
[im 5/16]
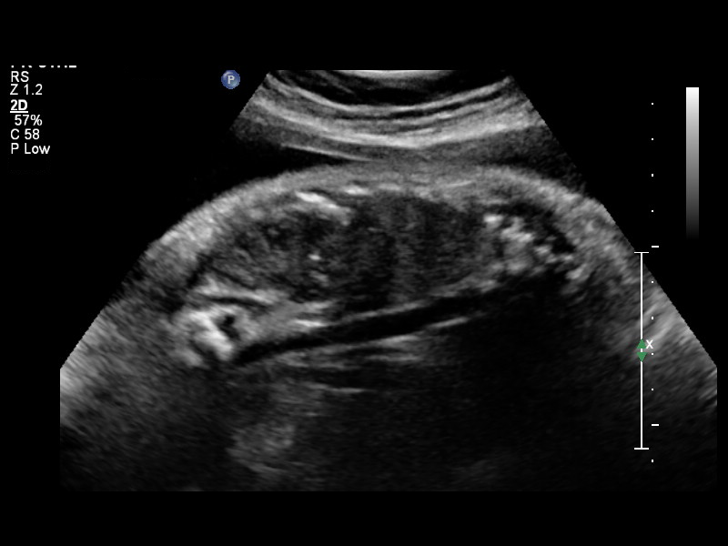
[im 6/16]
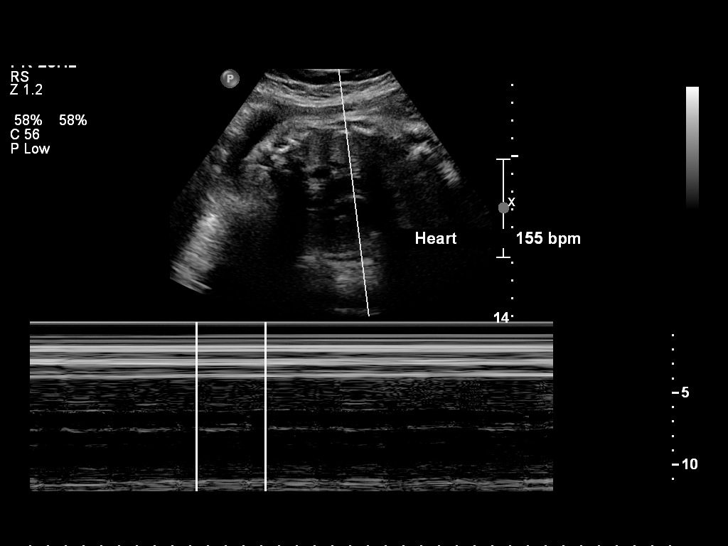
[im 7/16]
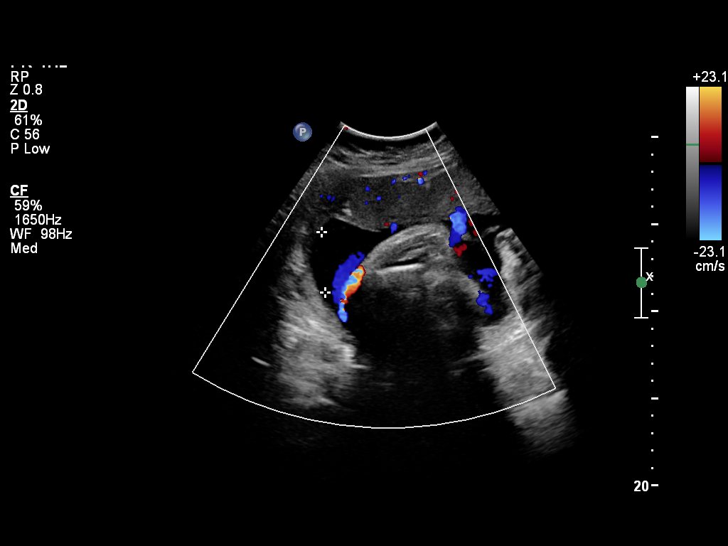
[im 8/16]
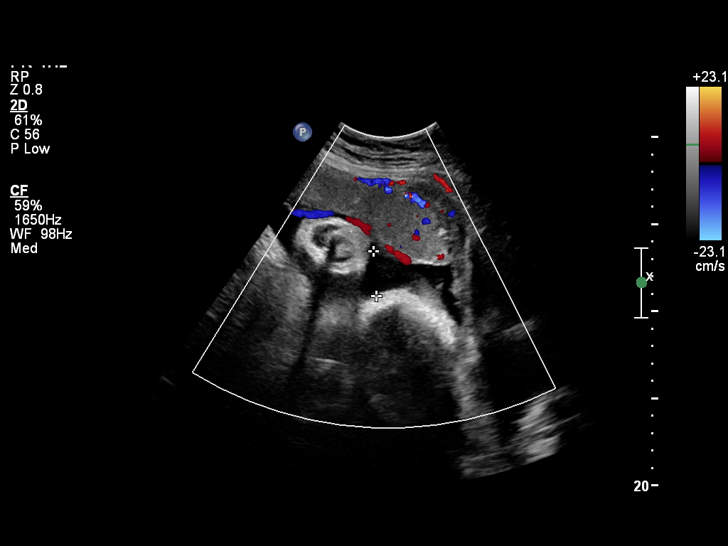
[im 9/16]
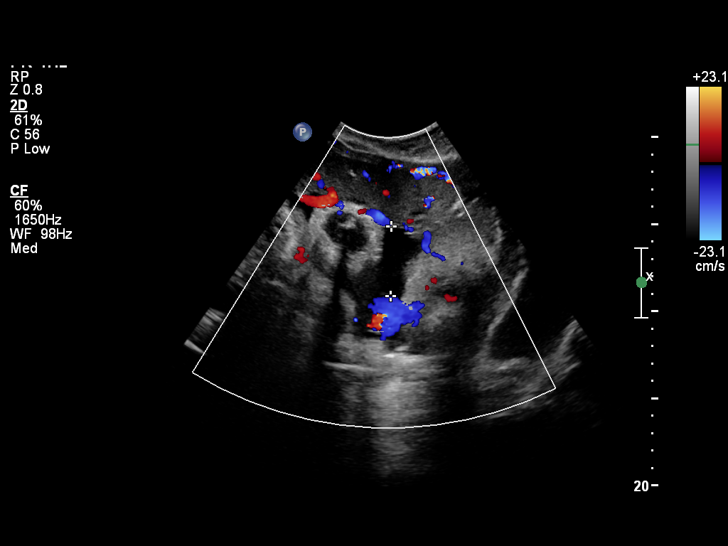
[im 10/16]
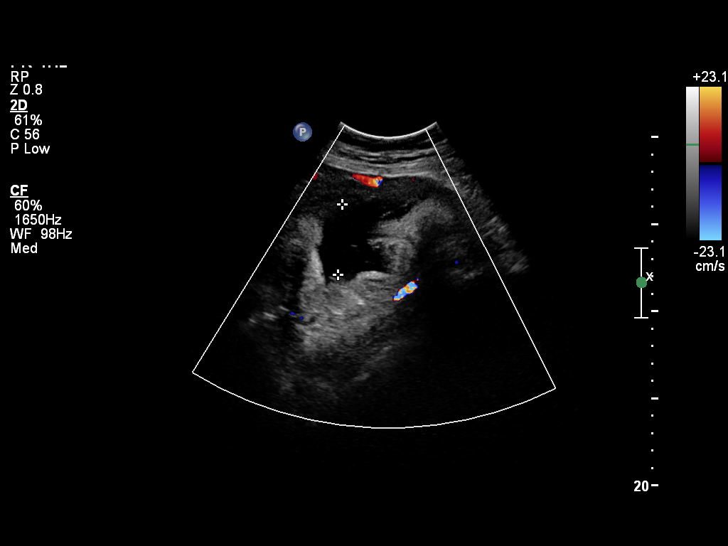
[im 11/16]
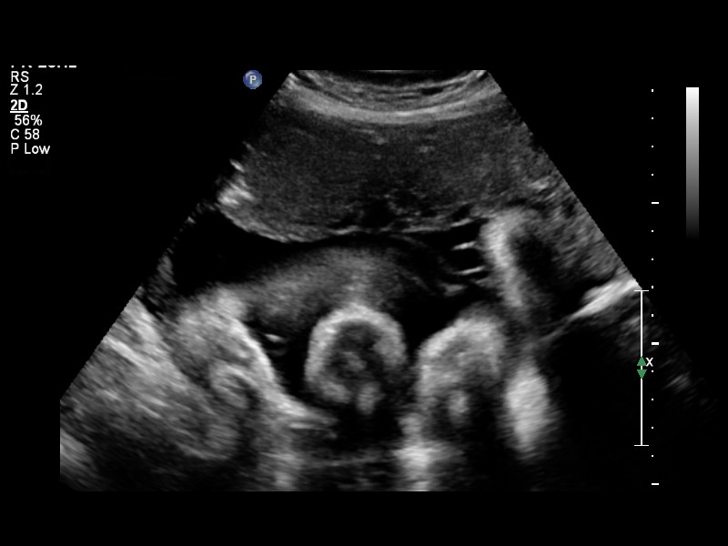
[im 13/16]
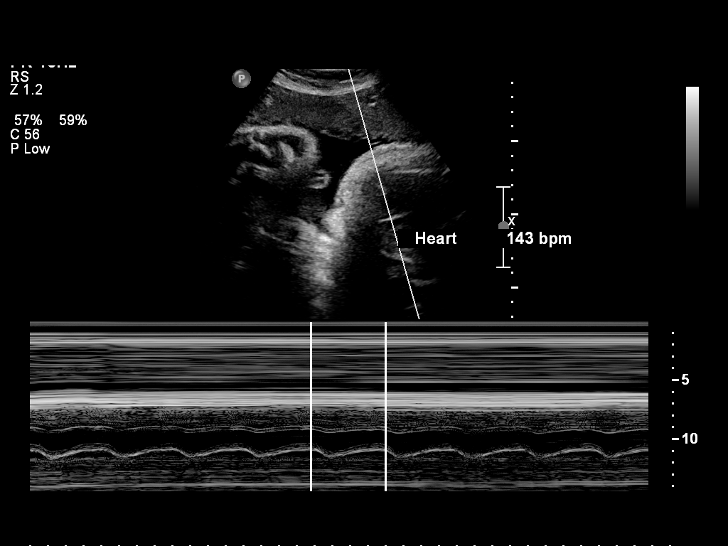
[im 14/16]
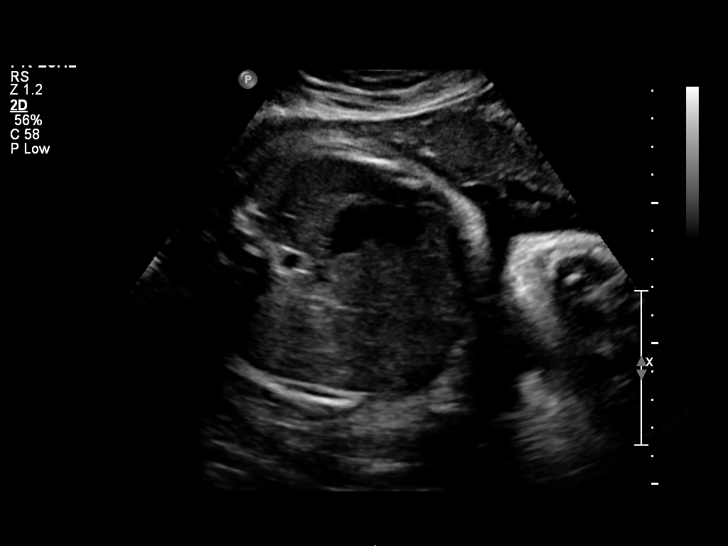
[im 15/16]
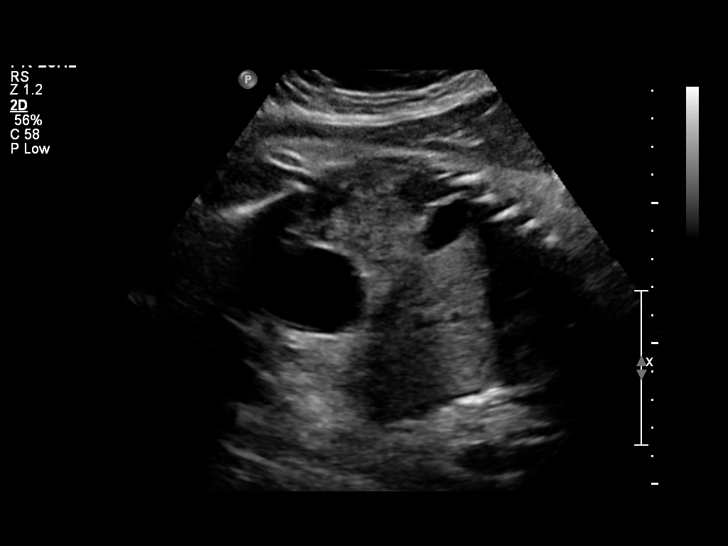
[im 16/16]
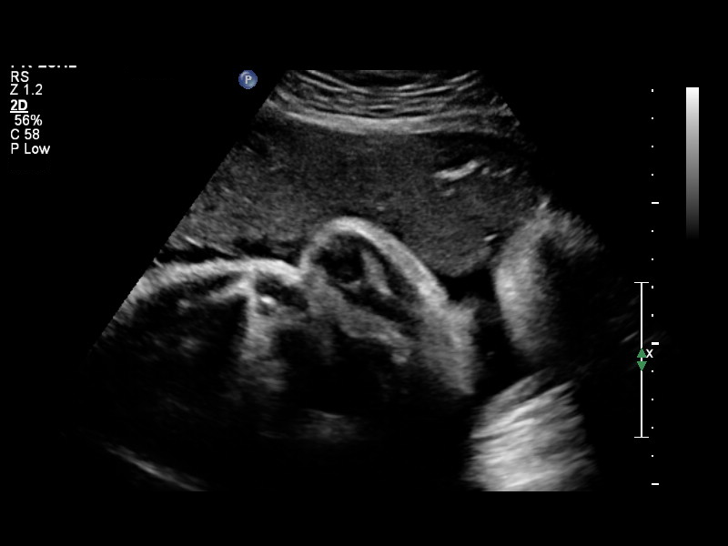

[14 of 16 positions shown; findings below may reference images not displayed]

OBSTETRICS REPORT
(Signed Final 02/17/2015 [DATE])

MAU/Triage
Service(s) Provided

Indications

Decreased fetal movement
36 weeks gestation of pregnancy
Fetal Evaluation

Num Of Fetuses:    1
Fetal Heart Rate:  155                          bpm
Cardiac Activity:  Observed
Presentation:      Cephalic

Comment:    [DATE] BPP in 14 mins

Amniotic Fluid
AFI FV:      Subjectively within normal limits
AFI Sum:     14.03   cm       51  %Tile      Larg Pckt:   4.03  cm
RUQ:   4.03    cm   RLQ:    3.98   cm    LUQ:   3.46    cm   LLQ:    2.56   cm
Biophysical Evaluation

Amniotic F.V:   Within normal limits       F. Tone:         Observed
F. Movement:    Observed                   Score:           [DATE]
F. Breathing:   Observed
Gestational Age

Best:          36w 3d     Det. By:  Early Ultrasound         EDD:   03/14/15
Impression

SIUP at 36+3 weeks
Normal amniotic fluid volume
BPP [DATE]
Recommendations

Follow-up as clinically indicated

## 2017-02-06 ENCOUNTER — Telehealth: Payer: Self-pay | Admitting: Obstetrics & Gynecology

## 2017-02-06 NOTE — Telephone Encounter (Signed)
Patient called stating she was told she was given the wrong test results at her PCP for chlamydia and herpes. She is unsure if they did a pap and wants a repeat. Advised patient to get records sent over for me to review then I would call her back. Verbalized understanding.

## 2017-02-06 NOTE — Telephone Encounter (Signed)
LMOVM retuning patient's call.

## 2017-02-21 ENCOUNTER — Telehealth: Payer: Self-pay | Admitting: Obstetrics & Gynecology

## 2017-02-21 ENCOUNTER — Telehealth: Payer: Self-pay | Admitting: *Deleted

## 2017-02-21 NOTE — Telephone Encounter (Signed)
Patient called stating she has missed her period by 8 days, took a pregnancy and it was positive. She is concerned since she received the Depo last month while she was on her period, pregnancy test was not done at providers office and is now pregnant. She is worried baby will have deformities. Advised patient to make appointment for pregnancy test here in our office and discuss with provider at visit. Verbalized understanding. Will get in tomorrow.

## 2017-02-21 NOTE — Telephone Encounter (Signed)
LMOVM returning call 

## 2017-02-22 ENCOUNTER — Ambulatory Visit: Payer: Self-pay | Admitting: Adult Health

## 2017-03-06 ENCOUNTER — Telehealth: Payer: Self-pay | Admitting: *Deleted

## 2017-03-06 NOTE — Telephone Encounter (Signed)
Left message to call office and schedule an appt. JSY

## 2017-04-25 ENCOUNTER — Telehealth: Payer: Self-pay | Admitting: *Deleted

## 2017-04-25 NOTE — Telephone Encounter (Signed)
LMOVM returning call 

## 2017-10-23 ENCOUNTER — Telehealth: Payer: Self-pay | Admitting: *Deleted

## 2017-10-23 NOTE — Telephone Encounter (Signed)
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 Patient very upset with lost of questions and wants more information. She did not feel like her PCP was very informative and has concerns if her husband is cheating. They are wanting to have another child but want more answers first. Will make appointment for patient to discuss with provider her concerns.

## 2017-10-24 ENCOUNTER — Ambulatory Visit: Payer: Self-pay | Admitting: Advanced Practice Midwife

## 2017-11-01 ENCOUNTER — Encounter: Payer: Self-pay | Admitting: *Deleted

## 2017-11-01 ENCOUNTER — Ambulatory Visit: Payer: BC Managed Care – PPO | Admitting: Women's Health

## 2017-11-14 ENCOUNTER — Telehealth: Payer: Self-pay | Admitting: Women's Health

## 2017-11-28 ENCOUNTER — Ambulatory Visit: Payer: BC Managed Care – PPO | Admitting: Women's Health

## 2022-04-28 ENCOUNTER — Encounter: Payer: Medicaid Other | Admitting: Obstetrics & Gynecology
